# Patient Record
Sex: Male | Born: 1975 | Hispanic: No | Marital: Married | Smoking: Never smoker
Health system: Southern US, Community
[De-identification: ages and names within clinical notes are randomized; demographics above are authoritative.]

## PROBLEM LIST (undated history)

## (undated) DIAGNOSIS — I10 Essential (primary) hypertension: Secondary | ICD-10-CM

## (undated) DIAGNOSIS — K4090 Unilateral inguinal hernia, without obstruction or gangrene, not specified as recurrent: Secondary | ICD-10-CM

## (undated) HISTORY — PX: HAND SURGERY: SHX662

## (undated) HISTORY — PX: APPENDECTOMY: SHX54

---
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
## 2002-10-15 ENCOUNTER — Encounter: Payer: Self-pay | Admitting: Emergency Medicine

## 2002-10-15 ENCOUNTER — Emergency Department (HOSPITAL_COMMUNITY): Admission: EM | Admit: 2002-10-15 | Discharge: 2002-10-15 | Payer: Self-pay | Admitting: Emergency Medicine

## 2002-11-19 ENCOUNTER — Emergency Department (HOSPITAL_COMMUNITY): Admission: EM | Admit: 2002-11-19 | Discharge: 2002-11-20 | Payer: Self-pay | Admitting: Emergency Medicine

## 2002-11-19 ENCOUNTER — Encounter: Payer: Self-pay | Admitting: Emergency Medicine

## 2010-11-02 ENCOUNTER — Ambulatory Visit
Admission: RE | Admit: 2010-11-02 | Discharge: 2010-11-02 | Disposition: A | Discharge status: Home / Self Care | Payer: Self-pay | Source: Ambulatory Visit | Attending: Family Medicine | Admitting: Family Medicine

## 2010-11-02 ENCOUNTER — Other Ambulatory Visit: Payer: Self-pay | Admitting: Family Medicine

## 2010-11-02 DIAGNOSIS — T1490XA Injury, unspecified, initial encounter: Secondary | ICD-10-CM

## 2013-02-18 ENCOUNTER — Other Ambulatory Visit: Payer: Self-pay | Admitting: Pediatrics

## 2013-02-18 ENCOUNTER — Ambulatory Visit: Payer: 59

## 2013-02-18 ENCOUNTER — Other Ambulatory Visit: Payer: Self-pay | Admitting: *Deleted

## 2013-02-18 DIAGNOSIS — S02402A Zygomatic fracture, unspecified, initial encounter for closed fracture: Secondary | ICD-10-CM

## 2016-04-12 ENCOUNTER — Ambulatory Visit: Payer: Self-pay | Admitting: Podiatry

## 2016-04-13 ENCOUNTER — Ambulatory Visit (INDEPENDENT_AMBULATORY_CARE_PROVIDER_SITE_OTHER): Payer: 59 | Admitting: Podiatry

## 2016-04-13 ENCOUNTER — Encounter: Payer: Self-pay | Admitting: Podiatry

## 2016-04-13 DIAGNOSIS — B351 Tinea unguium: Secondary | ICD-10-CM | POA: Diagnosis not present

## 2016-04-13 DIAGNOSIS — M79676 Pain in unspecified toe(s): Secondary | ICD-10-CM | POA: Diagnosis not present

## 2016-04-13 NOTE — Addendum Note (Signed)
Addended byMaury Dus: Yitzchak Kothari L on: 04/13/2016 09:21 AM   Modules accepted: Orders

## 2016-04-13 NOTE — Progress Notes (Signed)
   Subjective:    Patient ID: Andrew Tucker, male    DOB: 06/13/1975, 40 y.o.   MRN: [id number]  HPI's patient presents the office with chief complaint of nail fungus on both big toes. He states that the nails have been yellow and discolored for over a year the discoloration is noted on the inside border and causes occasional pain and discomfort. He says he had stride over-the-counter medications with minimal improvement. He presents the office today for an evaluation and treatment of these discolored toenails    Review of Systems  All other systems reviewed and are negative.      Objective:   Physical Exam GENERAL APPEARANCE: Alert, conversant. Appropriately groomed. No acute distress.  VASCULAR: Pedal pulses are  palpable at  [facility].  Capillary refill time is immediate to all digits,  Normal temperature gradient.  Digital hair growth is present bilateral  NEUROLOGIC: sensation is normal to 5.07 monofilament at 5/5 sites bilateral.  Light touch is intact bilateral, Muscle strength normal.  MUSCULOSKELETAL: acceptable muscle strength, tone and stability bilateral.  Intrinsic muscluature intact bilateral.  Rectus appearance of foot and digits noted bilateral.   DERMATOLOGIC: skin color, texture, and turgor are within normal limits.  No preulcerative lesions or ulcers  are seen, no interdigital maceration noted.   Dry plantar skin noted.. No drainage noted.  NAILS  Thick disfigured discolored hallux toenails  B/L         Assessment & Plan:  Onychomycosis  Hallux  B/L  IE.   nal fragments were taken from both hallux toenails to be sent to the lab.  He will be called when the results arrive.  We discussed Lamisil treatment as well as laser treatment for his nails. After the results the treatment option will be discussed again   Helane GuntherGregory Mayer DPM

## 2016-04-30 ENCOUNTER — Emergency Department: Admission: EM | Admit: 2016-04-30 | Discharge: 2016-04-30 | Discharge status: Absent without leave | Payer: Self-pay

## 2016-09-14 DIAGNOSIS — H9312 Tinnitus, left ear: Secondary | ICD-10-CM | POA: Insufficient documentation

## 2016-09-14 DIAGNOSIS — H9193 Unspecified hearing loss, bilateral: Secondary | ICD-10-CM | POA: Insufficient documentation

## 2018-12-10 ENCOUNTER — Other Ambulatory Visit: Payer: Self-pay

## 2018-12-10 ENCOUNTER — Emergency Department
Admission: EM | Admit: 2018-12-10 | Discharge: 2018-12-10 | Disposition: A | Discharge status: Home / Self Care | Payer: Self-pay | Source: Home / Self Care

## 2018-12-10 ENCOUNTER — Encounter: Payer: Self-pay | Admitting: Emergency Medicine

## 2018-12-10 DIAGNOSIS — S39012A Strain of muscle, fascia and tendon of lower back, initial encounter: Secondary | ICD-10-CM | POA: Diagnosis not present

## 2018-12-10 MED ORDER — CYCLOBENZAPRINE HCL 5 MG PO TABS: 5.0000 mg | ORAL_TABLET | Freq: Every day | ORAL | 0 refills | Status: DC

## 2018-12-10 MED ORDER — PREDNISONE 20 MG PO TABS: ORAL_TABLET | ORAL | 1 refills | Status: DC

## 2018-12-10 NOTE — ED Triage Notes (Signed)
Low back pain x 10 days, described as spasms 5 days later radiating down to left testicle, worse in morning and at night

## 2018-12-10 NOTE — ED Provider Notes (Signed)
[facility]    CSN: [id number] Arrival date & time: 12/10/18  1541     History   Chief Complaint Chief Complaint  Patient presents with  . Back Pain    HPI Morrie Daywalt is a 43 y.o. male.   Initial [facility] visit for this 43 yo man with low back pain.  He believes he hurt his back when wrestling with his 77 yo son 10 days ago in the outdoor pool.  He initially felt central L5 area pain which began radiating along L5 dermatome on last Thursday, 4 days ago.  No weakness, numbness, fever, groin bulge, or h/o back pain  Patient works in the [employer].     History reviewed. No pertinent past medical history.  There are no active problems to display for this patient.   History reviewed. No pertinent surgical history.     Home Medications    Prior to Admission medications   Medication Sig Start Date End Date Taking? Authorizing Provider  acetaminophen (TYLENOL) 500 MG tablet Take 500 mg by mouth every 6 (six) hours as needed.   Yes [provider]  aspirin 325 MG tablet Take 325 mg by mouth daily.   Yes [provider]  ibuprofen (ADVIL) 200 MG tablet Take 200 mg by mouth every 6 (six) hours as needed.   Yes [provider]  Menthol (ICY HOT BACK EXTRA STRENGTH) 5 % PTCH Apply topically.   Yes [provider]  cyclobenzaprine (FLEXERIL) 5 MG tablet Take 1 tablet (5 mg total) by mouth at bedtime. 12/10/18   Robyn Haber, MD  predniSONE (DELTASONE) 20 MG tablet 2 daily with food 12/10/18   Robyn Haber, MD    Family History Family History  Problem Relation Age of Onset  . Cancer Mother     Social History Social History   Tobacco Use  . Smoking status: Never Smoker  . Smokeless tobacco: Never Used  Substance Use Topics  . Alcohol use: Yes    Comment: occas.  . Drug use: No     Allergies   Seasonal ic [cholestatin]   Review of Systems Review of Systems  Constitutional: Negative.    Gastrointestinal: Negative.   Genitourinary: Negative.   Musculoskeletal: Positive for back pain.  All other systems reviewed and are negative.    Physical Exam Triage Vital Signs ED Triage Vitals  Enc Vitals Group     BP      Pulse      Resp      Temp      Temp src      SpO2      Weight      Height      Head Circumference      Peak Flow      Pain Score      Pain Loc      Pain Edu?      Excl. in [place]?    No data found.  Updated Vital Signs BP (!) 146/88 (BP Location: Right Arm)   Pulse (!) 53   Temp 98 F (36.7 C) (Oral)   Ht 5\' 8"  (1.727 m)   Wt 129.3 kg   SpO2 97%   BMI 43.33 kg/m    Physical Exam Vitals signs and nursing note reviewed.  Constitutional:      Appearance: Normal appearance. He is obese.  HENT:     Head: Normocephalic.  Eyes:     Conjunctiva/sclera: Conjunctivae normal.  Neck:  Musculoskeletal: Normal range of motion and neck supple.  Cardiovascular:     Rate and Rhythm: Normal rate.  Pulmonary:     Effort: Pulmonary effort is normal.  Musculoskeletal: Normal range of motion.     Comments: Pain with left side bend, negative SLR, negative point tenderness on back  Skin:    General: Skin is warm and dry.  Neurological:     General: No focal deficit present.     Mental Status: He is alert.  Psychiatric:        Mood and Affect: Mood normal.      UC Treatments / Results  Labs (all labs ordered are listed, but only abnormal results are displayed) Labs Reviewed - No data to display  EKG   Radiology No results found.  Procedures Procedures (including critical care time)  Medications Ordered in UC Medications - No data to display  Initial Impression / Assessment and Plan / UC Course  I have reviewed the triage vital signs and the nursing notes.  Pertinent labs & imaging results that were available during my care of the patient were reviewed by me and considered in my medical decision making (see chart for details).      Final Clinical Impressions(s) / UC Diagnoses   Final diagnoses:  Strain of lumbar region, initial encounter   Discharge Instructions   None    ED Prescriptions    Medication Sig Dispense Auth. Provider   predniSONE (DELTASONE) 20 MG tablet 2 daily with food 10 tablet Elvina SidleLauenstein, Lexi Conaty, MD   cyclobenzaprine (FLEXERIL) 5 MG tablet Take 1 tablet (5 mg total) by mouth at bedtime. 7 tablet Elvina SidleLauenstein, Aalivia Mcgraw, MD     Controlled Substance Prescriptions [place] Controlled Substance Registry consulted? Not Applicable   Elvina SidleLauenstein, Kjerstin Abrigo, MD 12/10/18 [phone]

## 2019-01-14 ENCOUNTER — Other Ambulatory Visit: Payer: Self-pay

## 2019-01-14 ENCOUNTER — Encounter: Payer: Self-pay | Admitting: Family Medicine

## 2019-01-14 ENCOUNTER — Ambulatory Visit (INDEPENDENT_AMBULATORY_CARE_PROVIDER_SITE_OTHER): Payer: 59 | Admitting: Family Medicine

## 2019-01-14 VITALS — BP 166/96 | HR 67 | Wt 284.0 lb

## 2019-01-14 DIAGNOSIS — R109 Unspecified abdominal pain: Secondary | ICD-10-CM

## 2019-01-14 DIAGNOSIS — R1032 Left lower quadrant pain: Secondary | ICD-10-CM

## 2019-01-14 HISTORY — DX: Morbid (severe) obesity due to excess calories: E66.01

## 2019-01-14 LAB — POCT URINALYSIS DIPSTICK
Bilirubin, UA: NEGATIVE
Glucose, UA: NEGATIVE
Ketones, UA: NEGATIVE
Leukocytes, UA: NEGATIVE
Nitrite, UA: NEGATIVE
Protein, UA: NEGATIVE
Spec Grav, UA: 1.015 (ref 1.010–1.025)
Urobilinogen, UA: 0.2 E.U./dL
pH, UA: 6 (ref 5.0–8.0)

## 2019-01-14 NOTE — Patient Instructions (Addendum)
Thank you for coming in today. Get labs now.  You will hear about CT scan scheduling soon.  Let me know if you do not hear anything soon.   Recheck in 2 weeks.  We will likely have more information then and have started on the right treatment. But we also need to follow along blood pressure and other health issues as well.    Flank Pain, Adult Flank pain is pain in your side. The flank is the area of your side between your upper belly (abdomen) and your back. The pain may occur over a short time (acute), or it may be long-term or come back often (chronic). It may be mild or very bad. Pain in this area can be caused by many different things. Follow these instructions at home:   Drink enough fluid to keep your pee (urine) clear or pale yellow.  Rest as told by your doctor.  Take over-the-counter and prescription medicines only as told by your doctor.  Keep a journal to keep track of: ? What has caused your flank pain. ? What has made it feel better.  Keep all follow-up visits as told by your doctor. This is important. Contact a doctor if:  Medicine does not help your pain.  You have new symptoms.  Your pain gets worse.  You have a fever.  Your symptoms last longer than 2-3 days.  You have trouble peeing.  You are peeing more often than normal. Get help right away if:  You have trouble breathing.  You are short of breath.  Your belly hurts, or it is swollen or red.  You feel sick to your stomach (nauseous).  You throw up (vomit).  You feel like you will pass out, or you do pass out (faint).  You have blood in your pee. Summary  Flank pain is pain in your side. The flank is the area of your side between your upper belly (abdomen) and your back.  Flank pain may occur over a short time (acute), or it may be long-term or come back often (chronic). It may be mild or very bad.  Pain in this area can be caused by many different things.  Contact your doctor if your  symptoms get worse or they last longer than 2-3 days. This information is not intended to replace advice given to you by your health care provider. Make sure you discuss any questions you have with your health care provider. Document Released: 01/26/2008 Document Revised: 03/31/2017 Document Reviewed: 08/08/2016 Elsevier Patient Education  2020 [employer].

## 2019-01-14 NOTE — Progress Notes (Signed)
Subjective:    CC: Left low back pain and abdominal pain.  HPI:  Back pain.  Patient developed back pain in early August after wrestling his 43 year old.  He was seen in urgent care on August 10.  At that time he had central low back pain radiating to his groin.  He was found to have back strain and prescribed prednisone and Flexeril.  He notes that he has had pain radiating from his low back to his abdomen and groin.  The pain today stops more in the left lower quadrant of his abdomen does not radiate all the way the groin.  He denies any pain radiating down his leg.  He denies weakness or numbness distally.  He denies any change with bowel movement or with urination.  No fevers chills nausea vomiting or diarrhea.  He notes that he does not have a medical doctor.  He has been told his blood pressure has been elevated in the past.  Past medical history, Surgical history, Family history not pertinant except as noted below, Social history, Allergies, and medications have been entered into the medical record, reviewed, and no changes needed.   Review of Systems: No headache, visual changes, nausea, vomiting, diarrhea, constipation, dizziness, abdominal pain, skin rash, fevers, chills, night sweats, weight loss, swollen lymph nodes, body aches, joint swelling, muscle aches, chest pain, shortness of breath, mood changes, visual or auditory hallucinations.   Objective:    Vitals:   01/14/19 1457  BP: (!) 166/96  Pulse: 67  SpO2: 97%  Body mass index is 43.18 kg/m.   Gen: Well NAD HEENT: EOMI,  MMM Lungs: Normal work of breathing. CTABL Heart: RRR no MRG Abd: NABS, Soft. Nondistended, tender palpation lower left quadrant with no rebound or guarding.  Pain reduces with contraction of abdominal wall musculature. MSK: Nontender to spinal midline.  Not particularly tender lumbar paraspinal musculature.  Normal lumbar motion without pain.  Lower extremity strength reflexes and sensation  are equal normal throughout. Exts: Brisk capillary refill, warm and well perfused.    Lab and Radiology Results Results for orders placed or performed in visit on 01/14/19 (from the past 72 hour(s))  POCT Urinalysis Dipstick     Status: None   Collection Time: 01/14/19  3:17 PM  Result Value Ref Range   Color, UA Yellow    Clarity, UA Clear    Glucose, UA Negative Negative   Bilirubin, UA Negative    Ketones, UA Negative    Spec Grav, UA 1.015 1.010 - 1.025   Blood, UA Trace    pH, UA 6.0 5.0 - 8.0   Protein, UA Negative Negative   Urobilinogen, UA 0.2 0.2 or 1.0 E.U./dL   Nitrite, UA Negative    Leukocytes, UA Negative Negative   Appearance     Odor     No results found.  Impression and Recommendations:    Assessment and Plan: 43 y.o. male with  Left low back pain with left lower quadrant abdominal pain.  Etiology is somewhat unclear.  The majority of his pain seems to be abdominal or flank related and likely not musculoskeletal.  His low back exam was largely benign and his abdominal exam improved with contraction of his abdominal wall musculature which is the opposite of what I would expect from a muscle strain in the abdominal wall..  Urinalysis today was significant for trace lysed blood however we will proceed with proper urine microscopic evaluation to truly rule out blood.  We  will also proceed with basic labs listed below to follow-up today's abdominal pain as well as prepare him for routine medical care.  He does not have a medical doctor and his blood pressure is elevated and he has morbid obesity.  Recheck 2 weeks.  Likely next up will be CT scan of abdomen.  PDMP not reviewed this encounter. Orders Placed This Encounter  Procedures  . CBC with Differential/Platelet  . COMPLETE METABOLIC PANEL WITH GFR  . Lipase  . LDL cholesterol, direct  . Urinalysis, microscopic only  . POCT Urinalysis Dipstick   No orders of the defined types were placed in this  encounter.   Discussed warning signs or symptoms. Please see discharge instructions. Patient expresses understanding.

## 2019-01-15 LAB — CBC WITH DIFFERENTIAL/PLATELET
Absolute Monocytes: 580 cells/uL (ref 200–950)
Basophils Absolute: 40 cells/uL (ref 0–200)
Basophils Relative: 0.4 %
Eosinophils Absolute: 120 cells/uL (ref 15–500)
Eosinophils Relative: 1.2 %
HCT: 40.6 % (ref 38.5–50.0)
Hemoglobin: 13.5 g/dL (ref 13.2–17.1)
Lymphs Abs: 2250 cells/uL (ref 850–3900)
MCH: 29 pg (ref 27.0–33.0)
MCHC: 33.3 g/dL (ref 32.0–36.0)
MCV: 87.1 fL (ref 80.0–100.0)
MPV: 12.2 fL (ref 7.5–12.5)
Monocytes Relative: 5.8 %
Neutro Abs: 7010 cells/uL (ref 1500–7800)
Neutrophils Relative %: 70.1 %
Platelets: 175 10*3/uL (ref 140–400)
RBC: 4.66 10*6/uL (ref 4.20–5.80)
RDW: 12.4 % (ref 11.0–15.0)
Total Lymphocyte: 22.5 %
WBC: 10 10*3/uL (ref 3.8–10.8)

## 2019-01-15 LAB — COMPLETE METABOLIC PANEL WITH GFR
AG Ratio: 1.4 (calc) (ref 1.0–2.5)
ALT: 44 U/L (ref 9–46)
AST: 20 U/L (ref 10–40)
Albumin: 4.1 g/dL (ref 3.6–5.1)
Alkaline phosphatase (APISO): 70 U/L (ref 36–130)
BUN: 18 mg/dL (ref 7–25)
CO2: 26 mmol/L (ref 20–32)
Calcium: 9.3 mg/dL (ref 8.6–10.3)
Chloride: 103 mmol/L (ref 98–110)
Creat: 0.86 mg/dL (ref 0.60–1.35)
GFR, Est African American: 123 mL/min/{1.73_m2} (ref >=60)
GFR, Est Non African American: 106 mL/min/{1.73_m2} (ref >=60)
Globulin: 2.9 g/dL (calc) (ref 1.9–3.7)
Glucose, Bld: 116 mg/dL (ref 65–139)
Potassium: 4 mmol/L (ref 3.5–5.3)
Sodium: 137 mmol/L (ref 135–146)
Total Bilirubin: 0.4 mg/dL (ref 0.2–1.2)
Total Protein: 7 g/dL (ref 6.1–8.1)

## 2019-01-15 LAB — LIPASE: Lipase: 21 U/L (ref 7–60)

## 2019-01-15 LAB — URINALYSIS, MICROSCOPIC ONLY
Bacteria, UA: NONE SEEN /HPF
Hyaline Cast: NONE SEEN /LPF
RBC / HPF: NONE SEEN /HPF (ref 0–2)
Squamous Epithelial / HPF: NONE SEEN /HPF (ref <=5)
WBC, UA: NONE SEEN /HPF (ref 0–5)

## 2019-01-15 LAB — LDL CHOLESTEROL, DIRECT: Direct LDL: 88 mg/dL (ref <100)

## 2019-01-15 NOTE — Addendum Note (Signed)
Addended by: Gregor Hams on: 01/15/2019 07:03 AM   Modules accepted: Orders

## 2019-01-18 ENCOUNTER — Ambulatory Visit (INDEPENDENT_AMBULATORY_CARE_PROVIDER_SITE_OTHER): Payer: 59

## 2019-01-18 ENCOUNTER — Other Ambulatory Visit: Payer: Self-pay

## 2019-01-18 DIAGNOSIS — R109 Unspecified abdominal pain: Secondary | ICD-10-CM

## 2019-01-18 DIAGNOSIS — R1032 Left lower quadrant pain: Secondary | ICD-10-CM

## 2019-01-18 MED ORDER — IOHEXOL 300 MG/ML  SOLN
100.0000 mL | Freq: Once | INTRAMUSCULAR | Status: AC | PRN
  Administered 2019-01-18: 100 mL via INTRAVENOUS

## 2019-01-21 ENCOUNTER — Telehealth: Payer: Self-pay | Admitting: Family Medicine

## 2019-01-21 DIAGNOSIS — K4090 Unilateral inguinal hernia, without obstruction or gangrene, not specified as recurrent: Secondary | ICD-10-CM | POA: Insufficient documentation

## 2019-01-21 HISTORY — DX: Unilateral inguinal hernia, without obstruction or gangrene, not specified as recurrent: K40.90

## 2019-01-21 NOTE — Telephone Encounter (Signed)
Hernia present on CT scan.  Plan to refer to general surgery for evaluation and possible management.

## 2019-01-28 ENCOUNTER — Other Ambulatory Visit: Payer: Self-pay

## 2019-01-28 ENCOUNTER — Encounter: Payer: Self-pay | Admitting: Family Medicine

## 2019-01-28 ENCOUNTER — Ambulatory Visit: Payer: 59 | Admitting: Family Medicine

## 2019-01-28 VITALS — BP 155/97 | HR 66 | Wt 286.2 lb

## 2019-01-28 DIAGNOSIS — I10 Essential (primary) hypertension: Secondary | ICD-10-CM

## 2019-01-28 DIAGNOSIS — M545 Low back pain, unspecified: Secondary | ICD-10-CM

## 2019-01-28 DIAGNOSIS — K4090 Unilateral inguinal hernia, without obstruction or gangrene, not specified as recurrent: Secondary | ICD-10-CM

## 2019-01-28 MED ORDER — LISINOPRIL-HYDROCHLOROTHIAZIDE 10-12.5 MG PO TABS: 1.0000 | ORAL_TABLET | Freq: Every day | ORAL | 1 refills | Status: DC

## 2019-01-28 NOTE — Progress Notes (Signed)
Andrew Tucker is a 43 y.o. male who presents to [facility]: Primary Care Sports Medicine today for follow up back/flank pain, and HTN.   LEFT Back/Flank/Abdominal Pain - Patient has had ongoing left sided lower back/flank since July that radiates to left lower quandrant of abdomen. Patient was wrestling with his son at the time the pain began. The pain has worsened since last visit two weeks ago. Pain does not worsen with eating. He denies nausea, vomiting, headache, fever, chills, diarrhea, melena, or hematochezia. He denies any right-sided pain. He reports recent constipation. Patient had CT Abdomen on 9/18 to evaluate pain which revealed a right sided inguinal hernia with no evidence of incarceration or strangulation.   HTN - Pt had an elevated BP at last visit of 166/96. Today BP is 155/97. Patient denies chest pain, SOB, LE edema.    ROS as above:  Exam:  BP (!) 155/97   Pulse 66   Wt 286 lb 3.2 oz (129.8 kg)   BMI 43.52 kg/m  Wt Readings from Last 5 Encounters:  01/28/19 286 lb 3.2 oz (129.8 kg)  01/14/19 284 lb (128.8 kg)  12/10/18 285 lb (129.3 kg)    Gen: Well NAD HEENT: EOMI,  MMM Lungs: Normal work of breathing.  Heart: No lower extremity edema.  Abd: NABS, Soft. Nondistended, No palpable masses. No rebound tenderness or guarding. No pain with contraction  of abdominal wall muscles. Tenderness with deep palpation of left lower quadrant.  Exts: Brisk capillary refill, warm and well perfused.  GU: Palpable right-sided inguinal hernia.  Not fully reducible.  Not particularly tender. MSK: L-spine: Normal-appearing.  Nontender spinal midline.  Nontender paraspinal musculature. Normal lumbar motion. .   Lab and Radiology Results Ct Abdomen Pelvis W Contrast  Result Date: 01/18/2019 CLINICAL DATA:  43 year old male with history of acute generalized abdominal pain. Left-sided  back pain. EXAM: CT ABDOMEN AND PELVIS WITH CONTRAST TECHNIQUE: Multidetector CT imaging of the abdomen and pelvis was performed using the standard protocol following bolus administration of intravenous contrast. CONTRAST:  OMNIPAQUE IOHEXOL 300 MG/ML  SOLN COMPARISON:  No priors. FINDINGS: Lower chest: Unremarkable. Hepatobiliary: No suspicious cystic or solid hepatic lesions. No intra or extrahepatic biliary ductal dilatation. Gallbladder is normal in appearance. Pancreas: No pancreatic mass. No pancreatic ductal dilatation. No pancreatic or peripancreatic fluid collections or inflammatory changes. Spleen: Unremarkable. Adrenals/Urinary Tract: Bilateral kidneys and adrenal glands are normal in appearance. No hydroureteronephrosis. The anterolateral aspect of the urinary bladder on the right partially extends into a right inguinoscrotal hernia. Stomach/Bowel: Normal appearance of the stomach. No pathologic dilatation of small bowel or colon. The appendix is not confidently identified and may be surgically absent. Regardless, there are no inflammatory changes noted adjacent to the cecum to suggest the presence of an acute appendicitis at this time. Vascular/Lymphatic: No significant atherosclerotic disease, aneurysm or dissection noted in the abdominal or pelvic vasculature. No lymphadenopathy noted in the abdomen or pelvis. Reproductive: Prostate gland and seminal vesicles are unremarkable in appearance. Other: Large right inguinoscrotal hernia containing predominantly fat, as well as a small portion of the anterolateral aspect of the urinary bladder on the right side. No significant volume of ascites. No pneumoperitoneum. Musculoskeletal: There are no aggressive appearing lytic or blastic lesions noted in the visualized portions of the skeleton. IMPRESSION: 1. No acute findings are noted in the abdomen or pelvis. 2. There is a large right inguinoscrotal hernia which contains predominantly fat, as well as  a  small portion of the anterolateral aspect of the urinary bladder on the right side. Electronically Signed   By: Trudie Reedaniel  Entrikin M.D.   On: 01/18/2019 14:22   I personally (independently) visualized and performed the interpretation of the images attached in this note.   Recent Results (from the past 2160 hour(s))  POCT Urinalysis Dipstick     Status: None   Collection Time: 01/14/19  3:17 PM  Result Value Ref Range   Color, UA Yellow    Clarity, UA Clear    Glucose, UA Negative Negative   Bilirubin, UA Negative    Ketones, UA Negative    Spec Grav, UA 1.015 1.010 - 1.025   Blood, UA Trace    pH, UA 6.0 5.0 - 8.0   Protein, UA Negative Negative   Urobilinogen, UA 0.2 0.2 or 1.0 E.U./dL   Nitrite, UA Negative    Leukocytes, UA Negative Negative   Appearance     Odor    Urinalysis, microscopic only     Status: None   Collection Time: 01/14/19  3:26 PM  Result Value Ref Range   WBC, UA NONE SEEN 0 - 5 /HPF   RBC / HPF NONE SEEN 0 - 2 /HPF   Squamous Epithelial / LPF NONE SEEN < OR = 5 /HPF   Bacteria, UA NONE SEEN NONE SEEN /HPF   Hyaline Cast NONE SEEN NONE SEEN /LPF  CBC with Differential/Platelet     Status: None   Collection Time: 01/14/19  3:43 PM  Result Value Ref Range   WBC 10.0 3.8 - 10.8 Thousand/uL   RBC 4.66 4.20 - 5.80 Million/uL   Hemoglobin 13.5 13.2 - 17.1 g/dL   HCT 16.140.6 09.638.5 - 04.550.0 %   MCV 87.1 80.0 - 100.0 fL   MCH 29.0 27.0 - 33.0 pg   MCHC 33.3 32.0 - 36.0 g/dL   RDW 40.912.4 81.111.0 - 91.415.0 %   Platelets 175 140 - 400 Thousand/uL   MPV 12.2 7.5 - 12.5 fL   Neutro Abs 7,010 1,500 - 7,800 cells/uL   Lymphs Abs 2,250 850 - 3,900 cells/uL   Absolute Monocytes 580 200 - 950 cells/uL   Eosinophils Absolute 120 15 - 500 cells/uL   Basophils Absolute 40 0 - 200 cells/uL   Neutrophils Relative % 70.1 %   Total Lymphocyte 22.5 %   Monocytes Relative 5.8 %   Eosinophils Relative 1.2 %   Basophils Relative 0.4 %  COMPLETE METABOLIC PANEL WITH GFR     Status: None    Collection Time: 01/14/19  3:43 PM  Result Value Ref Range   Glucose, Bld 116 65 - 139 mg/dL    Comment: .        Non-fasting reference interval .    BUN 18 7 - 25 mg/dL   Creat 7.820.86 9.560.60 - 2.131.35 mg/dL   GFR, Est Non African American 106 > OR = 60 mL/min/1.9673m2   GFR, Est African American 123 > OR = 60 mL/min/1.4373m2   BUN/Creatinine Ratio NOT APPLICABLE 6 - 22 (calc)   Sodium 137 135 - 146 mmol/L   Potassium 4.0 3.5 - 5.3 mmol/L   Chloride 103 98 - 110 mmol/L   CO2 26 20 - 32 mmol/L   Calcium 9.3 8.6 - 10.3 mg/dL   Total Protein 7.0 6.1 - 8.1 g/dL   Albumin 4.1 3.6 - 5.1 g/dL   Globulin 2.9 1.9 - 3.7 g/dL (calc)   AG Ratio 1.4 1.0 - 2.5 (  calc)   Total Bilirubin 0.4 0.2 - 1.2 mg/dL   Alkaline phosphatase (APISO) 70 36 - 130 U/L   AST 20 10 - 40 U/L   ALT 44 9 - 46 U/L  Lipase     Status: None   Collection Time: 01/14/19  3:43 PM  Result Value Ref Range   Lipase 21 7 - 60 U/L  LDL cholesterol, direct     Status: None   Collection Time: 01/14/19  3:43 PM  Result Value Ref Range   Direct LDL 88 <100 mg/dL    Comment: Greatly elevated Triglycerides values (>1200 mg/dL) interfere with the dLDL assay. As no Triglycerides  testing was ordered, interpret results with caution. . Desirable range <100 mg/dL for primary prevention;   <70 mg/dL for patients with CHD or diabetic patients  with > or = 2 CHD risk factors. .      Assessment and Plan: 43 y.o. male with a right-sided inguinal hernia that presents with lower flank pain that radiates to the left lower quadrant and HTN.   Left Sided Flank and Abdominal Pain Pain is likely musculoskeletal in origin however cannot rule out inguinal hernia referred pain as cause.  It is unlikely that the right-sided inguinal hernia is causing his left-sided pain.  Will refer to general surgery as below.  If surgery not planned in near future neck step will be physical therapy trial for evaluation and management of likely musculoskeletal pain  left side.  Right inguinal hernia: Patient already has an appointment scheduled with general surgery in October 1.  He has a relatively large hernia that will likely benefit from surgical repair.  HTN BP 155/67 today. This is patient's 2nd elevated BP reading. Will start Lisinopril-HCTZ 10-12.5 mg daily for HTN.  Recheck in about a month.   PDMP not reviewed this encounter. No orders of the defined types were placed in this encounter.  Meds ordered this encounter  Medications  . lisinopril-hydrochlorothiazide (ZESTORETIC) 10-12.5 MG tablet    Sig: Take 1 tablet by mouth daily.    Dispense:  90 tablet    Refill:  1     Historical information moved to improve visibility of documentation.  Past Medical History:  Diagnosis Date  . Hypertension 01/29/2019  . Morbid obesity ([place]) 01/14/2019  . Non-recurrent unilateral inguinal hernia without obstruction or gangrene 01/21/2019   No past surgical history on file. Social History   Tobacco Use  . Smoking status: Never Smoker  . Smokeless tobacco: Never Used  Substance Use Topics  . Alcohol use: Yes    Comment: occas.   family history includes Cancer in his mother.  Medications: Current Outpatient Medications  Medication Sig Dispense Refill  . acetaminophen (TYLENOL) 500 MG tablet Take 500 mg by mouth every 6 (six) hours as needed.    Marland Kitchen aspirin 325 MG tablet Take 325 mg by mouth daily.    Marland Kitchen ibuprofen (ADVIL) 200 MG tablet Take 200 mg by mouth every 6 (six) hours as needed.    . Menthol (ICY HOT BACK EXTRA STRENGTH) 5 % PTCH Apply topically.    Marland Kitchen lisinopril-hydrochlorothiazide (ZESTORETIC) 10-12.5 MG tablet Take 1 tablet by mouth daily. 90 tablet 1   No current facility-administered medications for this visit.    Allergies  Allergen Reactions  . Seasonal Ic [Cholestatin]      Discussed warning signs or symptoms. Please see discharge instructions. Patient expresses understanding.  I personally was present and performed or  re-performed the history, physical exam  and medical decision-making activities of this service and have verified that the service and findings are accurately documented in the student's note. ___________________________________________ Clementeen Graham M.D., ABFM., CAQSM. Primary Care and Sports Medicine Adjunct Instructor of Family Medicine  [employer]

## 2019-01-28 NOTE — Patient Instructions (Addendum)
Thank you for coming in today. Follow up with Surgery.  If surgery not planned in near future proceed with PT. Let me know.  Take lisinopril/HCTZ daily  Keep track of blood pressure.  Recheck in 4 weeks.    Inguinal Hernia, Adult An inguinal hernia is when fat or your intestines push through a weak spot in a muscle where your leg meets your lower belly (groin). This causes a rounded lump (bulge). This kind of hernia could also be:  In your scrotum, if you are male.  In folds of skin around your vagina, if you are male. There are three types of inguinal hernias. These include:  Hernias that can be pushed back into the belly (are reducible). This type rarely causes pain.  Hernias that cannot be pushed back into the belly (are incarcerated).  Hernias that cannot be pushed back into the belly and lose their blood supply (are strangulated). This type needs emergency surgery. If you do not have symptoms, you may not need treatment. If you have symptoms or a large hernia, you may need surgery. Follow these instructions at home: Lifestyle  Do these things if told by your doctor so you do not have trouble pooping (constipation): ? Drink enough fluid to keep your pee (urine) pale yellow. ? Eat foods that have a lot of fiber. These include fresh fruits and vegetables, whole grains, and beans. ? Limit foods that are high in fat and processed sugars. These include foods that are fried or sweet. ? Take medicine for trouble pooping.  Avoid lifting heavy objects.  Avoid standing for long amounts of time.  Do not use any products that contain nicotine or tobacco. These include cigarettes and e-cigarettes. If you need help quitting, ask your doctor.  Stay at a healthy weight. General instructions  You may try to push your hernia in by very gently pressing on it when you are lying down. Do not try to force the bulge back in if it will not push in easily.  Watch your hernia for any changes in  shape, size, or color. Tell your doctor if you see any changes.  Take over-the-counter and prescription medicines only as told by your doctor.  Keep all follow-up visits as told by your doctor. This is important. Contact a doctor if:  You have a fever.  You have new symptoms.  Your symptoms get worse. Get help right away if:  The area where your leg meets your lower belly has: ? Pain that gets worse suddenly. ? A bulge that gets bigger suddenly, and it does not get smaller after that. ? A bulge that turns red or purple. ? A bulge that is painful when you touch it.  You are a man, and your scrotum: ? Suddenly feels painful. ? Suddenly changes in size.  You cannot push the hernia in by very gently pressing on it when you are lying down. Do not try to force the bulge back in if it will not push in easily.  You feel sick to your stomach (nauseous), and that feeling does not go away.  You throw up (vomit), and that keeps happening.  You have a fast heartbeat.  You cannot poop (have a bowel movement) or pass gas. These symptoms may be an emergency. Do not wait to see if the symptoms will go away. Get medical help right away. Call your local emergency services (911 in the [place] Summary  An inguinal hernia is when fat or your intestines push  through a weak spot in a muscle where your leg meets your lower belly (groin). This causes a rounded lump (bulge).  If you do not have symptoms, you may not need treatment. If you have symptoms or a large hernia, you may need surgery.  Avoid lifting heavy objects. Also avoid standing for long amounts of time.  Do not try to force the bulge back in if it will not push in easily. This information is not intended to replace advice given to you by your health care provider. Make sure you discuss any questions you have with your health care provider. Document Released: 05/19/2006 Document Revised: 05/20/2017 Document Reviewed: 01/18/2017 Elsevier  Patient Education  2020 [employer].

## 2019-01-29 ENCOUNTER — Encounter: Payer: Self-pay | Admitting: Family Medicine

## 2019-01-29 DIAGNOSIS — I10 Essential (primary) hypertension: Secondary | ICD-10-CM

## 2019-01-29 HISTORY — DX: Essential (primary) hypertension: I10

## 2019-01-31 ENCOUNTER — Ambulatory Visit: Payer: Self-pay | Admitting: General Surgery

## 2019-01-31 NOTE — H&P (Signed)
History of Present Illness Axel Filler MD; 01/31/2019 9:47 AM) The patient is a 43 year old male who presents with an inguinal hernia. Referred by: Dr. Denyse Amass Chief Complaint: Right inguinal hernia  Patient is a 43 year old male, who comes in secondary to a right inguinal hernia seen on CT scan. Patient has a history of hypertension. Patient just recently started treatment on this this week. Patient states he was having left back and left inguinal pain. He states that his given a muscle relaxer to see if this helps however this continues to bother him. Patient was sent for CT scan to further evaluate the pain. At this time patient was found have a large right inguinal scrotal hernia. This was unknown to the patient. Patient has had continuing left lower quadrant abdominal pain and left back pain. Patient works at [employer], he does have an active lifestyle.  I did review CT scan personally. CT scan did reveal a right large inguinal hernia with sliding component The bladder.    Past Surgical History Schuyler Amor, CMA; 01/31/2019 9:30 AM) Appendectomy   Diagnostic Studies History Schuyler Amor, CMA; 01/31/2019 9:30 AM) Colonoscopy  never  Allergies Schuyler Amor, CMA; 01/31/2019 9:31 AM) No Known Drug Allergies  [01/31/2019]: Allergies Reconciled   Medication History Schuyler Amor, CMA; 01/31/2019 9:31 AM) Lisinopril (10MG  Tablet, Oral) Active. Medications Reconciled  Social History , CMA; 01/31/2019 9:30 AM) Alcohol use  Occasional alcohol use. Caffeine use  Coffee. No drug use  Tobacco use  Never smoker.  Family History 04/02/2019, CMA; 01/31/2019 9:30 AM) Diabetes Mellitus  Father. Rectal Cancer  Mother.  Other Problems 04/02/2019, CMA; 01/31/2019 9:30 AM) Back Pain  High blood pressure     Review of Systems 04/02/2019 MD; 01/31/2019 9:45 AM) General Not Present- Appetite Loss, Chills, Fatigue, Fever, Night Sweats, Weight  Gain and Weight Loss. Skin Not Present- Change in Wart/Mole, Dryness, Hives, Jaundice, New Lesions, Non-Healing Wounds, Rash and Ulcer. HEENT Not Present- Earache, Hearing Loss, Hoarseness, Nose Bleed, Oral Ulcers, Ringing in the Ears, Seasonal Allergies, Sinus Pain, Sore Throat, Visual Disturbances, Wears glasses/contact lenses and Yellow Eyes. Respiratory Not Present- Bloody sputum, Chronic Cough, Difficulty Breathing, Snoring and Wheezing. Breast Not Present- Breast Mass, Breast Pain, Nipple Discharge and Skin Changes. Cardiovascular Not Present- Chest Pain, Difficulty Breathing Lying Down, Leg Cramps, Palpitations, Rapid Heart Rate, Shortness of Breath and Swelling of Extremities. Gastrointestinal Present- Abdominal Pain. Not Present- Bloating, Bloody Stool, Change in Bowel Habits, Chronic diarrhea, Constipation, Difficulty Swallowing, Excessive gas, Gets full quickly at meals, Hemorrhoids, Indigestion, Nausea, Rectal Pain and Vomiting. Male Genitourinary Not Present- Blood in Urine, Change in Urinary Stream, Frequency, Impotence, Nocturia, Painful Urination, Urgency and Urine Leakage. Musculoskeletal Present- Back Pain and Muscle Pain. Not Present- Joint Pain, Joint Stiffness, Muscle Weakness and Swelling of Extremities. Neurological Not Present- Decreased Memory, Fainting, Headaches, Numbness, Seizures, Tingling, Tremor, Trouble walking and Weakness. Psychiatric Not Present- Anxiety, Bipolar, Change in Sleep Pattern, Depression, Fearful and Frequent crying. Endocrine Not Present- Cold Intolerance, Excessive Hunger, Hair Changes, Heat Intolerance, Hot flashes and New Diabetes. Hematology Not Present- Blood Thinners, Easy Bruising, Excessive bleeding, Gland problems, HIV and Persistent Infections. All other systems negative  Vitals 04/02/2019 CMA; 01/31/2019 9:32 AM) 01/31/2019 9:32 AM Weight: 280 lb Height: 68in Body Surface Area: 2.36 m Body Mass Index: 42.57 kg/m  Temp.: 98.53F   Pulse: 78 (Regular)  BP: 170/100(Sitting, Left Arm, Standard)       Physical Exam 04/02/2019 MD; 01/31/2019  9:47 AM) The physical exam findings are as follows: Note: Constitutional: No acute distress, conversant, appears stated age  Eyes: Anicteric sclerae, moist conjunctiva, no lid lag  Neck: No thyromegaly, trachea midline, no cervical lymphadenopathy  Lungs: Clear to auscultation biilaterally, normal respiratory effot  Cardiovascular: regular rate & rhythm, no murmurs, no peripheal edema, pedal pulses 2+  GI: Soft, no masses or hepatosplenomegaly, non-tender to palpation  MSK: Normal gait, no clubbing cyanosis, edema  Skin: No rashes, palpation reveals normal skin turgor  Psychiatric: Appropriate judgment and insight, oriented to person, place, and time  Abdomen Inspection Hernias - Inguinal hernia - Right - Reducible - Right (Large inguinal scrotal) .    Assessment & Plan Ralene Ok MD; 01/31/2019 9:48 AM)  RIGHT INGUINAL HERNIA (K40.90) Impression: 43 year old male with a history of right inguinal hernia, history of hypertension Patient follow-up with his PCP at the end of October for blood pressure check. 1. The patient will like to proceed to the operating room for open right inguinal hernia repair with mesh.  2. I discussed with the patient the signs and symptoms of incarceration and strangulation and the need to proceed to the ER should they occur.  3. I discussed with the patient the risks and benefits of the procedure to include but not limited to: Infection, bleeding, damage to surrounding structures, possible need for further surgery, possible nerve pain, and possible recurrence. The patient was understanding and wishes to proceed.

## 2019-02-25 ENCOUNTER — Encounter: Payer: Self-pay | Admitting: Family Medicine

## 2019-02-25 ENCOUNTER — Other Ambulatory Visit: Payer: Self-pay

## 2019-02-25 ENCOUNTER — Ambulatory Visit (INDEPENDENT_AMBULATORY_CARE_PROVIDER_SITE_OTHER): Payer: 59 | Admitting: Family Medicine

## 2019-02-25 VITALS — BP 135/87 | HR 74 | Temp 98.3°F | Wt 283.0 lb

## 2019-02-25 DIAGNOSIS — M545 Low back pain, unspecified: Secondary | ICD-10-CM

## 2019-02-25 DIAGNOSIS — K4090 Unilateral inguinal hernia, without obstruction or gangrene, not specified as recurrent: Secondary | ICD-10-CM

## 2019-02-25 DIAGNOSIS — I10 Essential (primary) hypertension: Secondary | ICD-10-CM | POA: Diagnosis not present

## 2019-02-25 MED ORDER — LISINOPRIL-HYDROCHLOROTHIAZIDE 20-25 MG PO TABS: 1.0000 | ORAL_TABLET | Freq: Every day | ORAL | 3 refills | Status: DC

## 2019-02-25 NOTE — Patient Instructions (Signed)
Thank you for coming in today. Increase the lisinopril/HCTZ to 20/25.  Take it in the morning.  Get labs in about 2 weeks.  Let me know if the pain does not improve after surgery.  NEXT step is physical therapy.

## 2019-02-25 NOTE — Progress Notes (Signed)
Andrew Tucker is a 43 y.o. male who presents to [facility] today for back pain.  Patient developed acute onset of back pain in August wrestling his 61 year old son.  Pain radiating from low back to abdomen and groin on the left side of his abdomen.  No radiating pain weakness or numbness distally.  Pain was thought to be possibly abdominal muscular strain.  He had CT scan of the abdomen which showed a right-sided inguinal hernia but no explanation for his left-sided pain.  He was referred to general surgery for hernia repair and has surgery scheduled November 20.  He notes the pain is still a little bit present but a lot better.  He feels pain more with activity and better with rest.  No radiating pain or numbness.  Additionally he has a history of elevated blood pressure was finally diagnosed with hypertension on September 28.  Treated empirically with lisinopril/hydrochlorothiazide 10/12.5.  He feels well no chest pain palpitations lightheadedness or dizziness.  ROS as above:  Exam:  BP 135/87   Pulse 74   Temp 98.3 F (36.8 C) (Oral)   Wt 283 lb (128.4 kg)   BMI 43.03 kg/m  Wt Readings from Last 5 Encounters:  02/25/19 283 lb (128.4 kg)  01/28/19 286 lb 3.2 oz (129.8 kg)  01/14/19 284 lb (128.8 kg)  12/10/18 285 lb (129.3 kg)    Gen: Well NAD HEENT: EOMI,  MMM Lungs: Normal work of breathing. CTABL Heart: RRR no MRG Abd: NABS, Soft. Nondistended, Nontender Exts: Brisk capillary refill, warm and well perfused.      Assessment and Plan: 43 y.o. male with  Hypertension: Blood pressure still a bit elevated.  Plan increase to lisinopril/hydrochlorothiazide 20/25.  Basic metabolic panel ordered plan to get that done in the next 2 to 3 weeks.  If all is well from a hypertension standpoint recheck yearly.  Back pain: Likely myofascial strain and core  muscular dysfunction.  Discussed options.  At this point will see how things go after surgery.  If still having pain would likely refer to physical therapy at that point.  Inguinal hernia: Surgery pending November 20.  PDMP not reviewed this encounter. Orders Placed This Encounter  Procedures  . BASIC METABOLIC PANEL WITH GFR   Meds ordered this encounter  Medications  . lisinopril-hydrochlorothiazide (ZESTORETIC) 20-25 MG tablet    Sig: Take 1 tablet by mouth daily.    Dispense:  90 tablet    Refill:  3     Historical information moved to improve visibility of documentation.  Past Medical History:  Diagnosis Date  . Hypertension 01/29/2019  . Morbid obesity ([place]) 01/14/2019  . Non-recurrent unilateral inguinal hernia without obstruction or gangrene 01/21/2019   No past surgical history on file. Social History   Tobacco Use  . Smoking status: Never Smoker  . Smokeless tobacco: Never Used  Substance Use Topics  . Alcohol use: Yes    Comment: occas.   family history includes Cancer in his mother.  Medications: Current Outpatient Medications  Medication Sig Dispense Refill  . acetaminophen (TYLENOL) 500 MG tablet Take 500 mg by mouth every 6 (six) hours as needed.    Marland Kitchen aspirin 325 MG tablet Take 325 mg by mouth daily.    Marland Kitchen ibuprofen (ADVIL) 200 MG tablet Take 200 mg by mouth every 6 (six) hours as needed.    Marland Kitchen lisinopril-hydrochlorothiazide (ZESTORETIC) 20-25 MG tablet Take 1 tablet by  mouth daily. 90 tablet 3  . Menthol (ICY HOT BACK EXTRA STRENGTH) 5 % PTCH Apply topically.     No current facility-administered medications for this visit.    Allergies  Allergen Reactions  . Seasonal Ic [Cholestatin]      Discussed warning signs or symptoms. Please see discharge instructions. Patient expresses understanding.

## 2019-03-14 LAB — BASIC METABOLIC PANEL WITH GFR
BUN: 19 mg/dL (ref 7–25)
CO2: 28 mmol/L (ref 20–32)
Calcium: 9.6 mg/dL (ref 8.6–10.3)
Chloride: 99 mmol/L (ref 98–110)
Creat: 0.85 mg/dL (ref 0.60–1.35)
GFR, Est African American: 124 mL/min/{1.73_m2} (ref >=60)
GFR, Est Non African American: 107 mL/min/{1.73_m2} (ref >=60)
Glucose, Bld: 100 mg/dL — ABNORMAL HIGH (ref 65–99)
Potassium: 4.6 mmol/L (ref 3.5–5.3)
Sodium: 134 mmol/L — ABNORMAL LOW (ref 135–146)

## 2019-03-18 NOTE — Progress Notes (Signed)
[facility] [phone] - [place], [place] - [address] [place] [address] Phone: [phone] Fax: [phone]      Your procedure is scheduled on Friday, 03/22/2019.  Report to [facility] Main Entrance "A" at 07:00 A.M., and check in at the Admitting office.  Call this number if you have problems the morning of surgery:  [phone]   Call [phone] if you have any questions prior to your surgery date Monday-Friday 8am-4pm    Remember:  Do not eat or drink after midnight the night before your surgery    DO NOT take any medications the day of surgery  As of now, STOP taking any Aspirin (unless otherwise instructed by your surgeon), Aleve, Naproxen, Ibuprofen, Motrin, Advil, Goody's, BC's, all herbal medications, fish oil, and all vitamins.    The Morning of Surgery  Do not wear jewelry.  Do not wear lotions, powders, colognes, or deodorant  Men may shave face and neck.  Do not bring valuables to the hospital.  [facility] is not responsible for any belongings or valuables.  If you are a smoker, DO NOT Smoke 24 hours prior to surgery  If you wear a CPAP at night please bring your mask, tubing, and machine the morning of surgery   Remember that you must have someone to transport you home after your surgery, and remain with you for 24 hours if you are discharged the same day.   Contacts, glasses, hearing aids, dentures or bridgework may not be worn into surgery.    Leave your suitcase in the car.  After surgery it may be brought to your room.  For patients admitted to the hospital, discharge time will be determined by your treatment team.  Patients discharged the day of surgery will not be allowed to drive home.    Special instructions:   [facility]- Preparing For Surgery  Before surgery, you can play an important role. Because skin is not sterile, your skin needs to be as free of germs as possible. You can reduce the number  of germs on your skin by washing with CHG (chlorahexidine gluconate) Soap before surgery.  CHG is an antiseptic cleaner which kills germs and bonds with the skin to continue killing germs even after washing.    Oral Hygiene is also important to reduce your risk of infection.  Remember - BRUSH YOUR TEETH THE MORNING OF SURGERY WITH YOUR REGULAR TOOTHPASTE  Please do not use if you have an allergy to CHG or antibacterial soaps. If your skin becomes reddened/irritated stop using the CHG.  Do not shave (including legs and underarms) for at least 48 hours prior to first CHG shower. It is OK to shave your face.  Please follow these instructions carefully.   1. Shower the NIGHT BEFORE SURGERY and the MORNING OF SURGERY with CHG Soap.   2. If you chose to wash your hair, wash your hair first as usual with your normal shampoo.  3. After you shampoo, rinse your hair and body thoroughly to remove the shampoo.  4. Use CHG as you would any other liquid soap. You can apply CHG directly to the skin and wash gently with a scrungie or a clean washcloth.   5. Apply the CHG Soap to your body ONLY FROM THE NECK DOWN.  Do not use on open wounds or open sores. Avoid contact with your eyes, ears, mouth and genitals (private parts). Wash Face and genitals (private parts)  with your normal  soap.   6. Wash thoroughly, paying special attention to the area where your surgery will be performed.  7. Thoroughly rinse your body with warm water from the neck down.  8. DO NOT shower/wash with your normal soap after using and rinsing off the CHG Soap.  9. Pat yourself dry with a CLEAN TOWEL.  10. Wear CLEAN PAJAMAS to bed the night before surgery, wear comfortable clothes the morning of surgery  11. Place CLEAN SHEETS on your bed the night of your first shower and DO NOT SLEEP WITH PETS.    Day of Surgery:  Please shower the morning of surgery with the CHG soap Do not apply any deodorants/lotions.  Please wear  clean clothes to the hospital/surgery center.   Remember to brush your teeth WITH YOUR REGULAR TOOTHPASTE.   Please read over the following fact sheets that you were given.

## 2019-03-19 ENCOUNTER — Other Ambulatory Visit: Payer: Self-pay

## 2019-03-19 ENCOUNTER — Other Ambulatory Visit (HOSPITAL_COMMUNITY)
Admission: RE | Admit: 2019-03-19 | Discharge: 2019-03-19 | Disposition: A | Discharge status: Home / Self Care | Payer: 59 | Source: Ambulatory Visit | Attending: General Surgery | Admitting: General Surgery

## 2019-03-19 ENCOUNTER — Encounter (HOSPITAL_COMMUNITY): Payer: Self-pay

## 2019-03-19 ENCOUNTER — Encounter (HOSPITAL_COMMUNITY)
Admission: RE | Admit: 2019-03-19 | Discharge: 2019-03-19 | Disposition: A | Discharge status: Home / Self Care | Payer: 59 | Source: Ambulatory Visit | Attending: General Surgery | Admitting: General Surgery

## 2019-03-19 DIAGNOSIS — Z20828 Contact with and (suspected) exposure to other viral communicable diseases: Secondary | ICD-10-CM | POA: Insufficient documentation

## 2019-03-19 DIAGNOSIS — Z01818 Encounter for other preprocedural examination: Secondary | ICD-10-CM | POA: Insufficient documentation

## 2019-03-19 LAB — CBC
HCT: 42.7 % (ref 39.0–52.0)
Hemoglobin: 14.6 g/dL (ref 13.0–17.0)
MCH: 29.8 pg (ref 26.0–34.0)
MCHC: 34.2 g/dL (ref 30.0–36.0)
MCV: 87.1 fL (ref 80.0–100.0)
Platelets: 313 10*3/uL (ref 150–400)
RBC: 4.9 MIL/uL (ref 4.22–5.81)
RDW: 11.8 % (ref 11.5–15.5)
WBC: 8.6 10*3/uL (ref 4.0–10.5)
nRBC: 0 % (ref 0.0–0.2)

## 2019-03-19 NOTE — Progress Notes (Signed)
PCP - Dr. Lynne Leader Cardiologist - Patient denies  PPM/ICD - n/a Device Orders -  Rep Notified -   Chest x-ray - n/a EKG - 03/19/2019 Stress Test - Patient denies ECHO - Patient denies Cardiac Cath - Patient denies  Sleep Study - Patient denies CPAP -   Fasting Blood Sugar - n/a Checks Blood Sugar _____ times a day  Blood Thinner Instructions: n/a Aspirin Instructions:  ERAS Protcol - n/a PRE-SURGERY Ensure or G2-   COVID TEST- 03/19/2019   Anesthesia review: n/a  Patient denies shortness of breath, fever, cough and chest pain at PAT appointment   All instructions explained to the patient, with a verbal understanding of the material. Patient agrees to go over the instructions while at home for a better understanding. Patient also instructed to self quarantine after being tested for COVID-19. The opportunity to ask questions was provided.

## 2019-03-20 LAB — NOVEL CORONAVIRUS, NAA (HOSP ORDER, SEND-OUT TO REF LAB; TAT 18-24 HRS): SARS-CoV-2, NAA: NOT DETECTED

## 2019-03-21 MED ORDER — DEXTROSE 5 % IV SOLN
3.0000 g | INTRAVENOUS | Status: AC
  Administered 2019-03-22: 3 g via INTRAVENOUS
  Filled 2019-03-21: qty 3

## 2019-03-22 ENCOUNTER — Encounter (HOSPITAL_COMMUNITY): Payer: Self-pay

## 2019-03-22 ENCOUNTER — Ambulatory Visit (HOSPITAL_COMMUNITY): Payer: 59 | Admitting: Anesthesiology

## 2019-03-22 ENCOUNTER — Other Ambulatory Visit: Payer: Self-pay

## 2019-03-22 ENCOUNTER — Encounter (HOSPITAL_COMMUNITY)
Admission: RE | Disposition: A | Discharge status: Home / Self Care | Payer: Self-pay | Source: Home / Self Care | Attending: General Surgery

## 2019-03-22 ENCOUNTER — Ambulatory Visit (HOSPITAL_COMMUNITY)
Admission: RE | Admit: 2019-03-22 | Discharge: 2019-03-22 | Disposition: A | Discharge status: Home / Self Care | Payer: 59 | Attending: General Surgery | Admitting: General Surgery

## 2019-03-22 DIAGNOSIS — I10 Essential (primary) hypertension: Secondary | ICD-10-CM | POA: Insufficient documentation

## 2019-03-22 DIAGNOSIS — Z79899 Other long term (current) drug therapy: Secondary | ICD-10-CM | POA: Insufficient documentation

## 2019-03-22 DIAGNOSIS — K4090 Unilateral inguinal hernia, without obstruction or gangrene, not specified as recurrent: Secondary | ICD-10-CM | POA: Insufficient documentation

## 2019-03-22 HISTORY — PX: INGUINAL HERNIA REPAIR: SHX194

## 2019-03-22 SURGERY — REPAIR, HERNIA, INGUINAL, ADULT
Anesthesia: General | Site: Groin | Laterality: Right

## 2019-03-22 MED ORDER — FENTANYL CITRATE (PF) 100 MCG/2ML IJ SOLN
100.0000 ug | Freq: Once | INTRAMUSCULAR | Status: AC
  Administered 2019-03-22: 09:00:00 100 ug via INTRAVENOUS

## 2019-03-22 MED ORDER — MIDAZOLAM HCL 5 MG/5ML IJ SOLN
INTRAMUSCULAR | Status: DC | PRN
  Administered 2019-03-22: 2 mg via INTRAVENOUS

## 2019-03-22 MED ORDER — GLYCOPYRROLATE 0.2 MG/ML IJ SOLN
INTRAMUSCULAR | Status: DC | PRN
  Administered 2019-03-22: 0.2 mg via INTRAVENOUS

## 2019-03-22 MED ORDER — OXYCODONE HCL 5 MG PO TABS
ORAL_TABLET | ORAL | Status: AC
  Filled 2019-03-22: qty 1

## 2019-03-22 MED ORDER — MIDAZOLAM HCL 2 MG/2ML IJ SOLN
INTRAMUSCULAR | Status: AC
  Filled 2019-03-22: qty 2

## 2019-03-22 MED ORDER — ROCURONIUM BROMIDE 10 MG/ML (PF) SYRINGE
PREFILLED_SYRINGE | INTRAVENOUS | Status: DC | PRN
  Administered 2019-03-22: 50 mg via INTRAVENOUS
  Administered 2019-03-22: 20 mg via INTRAVENOUS

## 2019-03-22 MED ORDER — FENTANYL CITRATE (PF) 250 MCG/5ML IJ SOLN
INTRAMUSCULAR | Status: AC
  Filled 2019-03-22: qty 5

## 2019-03-22 MED ORDER — LACTATED RINGERS IV SOLN
INTRAVENOUS | Status: DC
  Administered 2019-03-22 (×2): via INTRAVENOUS

## 2019-03-22 MED ORDER — FENTANYL CITRATE (PF) 100 MCG/2ML IJ SOLN
INTRAMUSCULAR | Status: AC
  Filled 2019-03-22: qty 2

## 2019-03-22 MED ORDER — SUGAMMADEX SODIUM 200 MG/2ML IV SOLN
INTRAVENOUS | Status: DC | PRN
  Administered 2019-03-22: 400 mg via INTRAVENOUS

## 2019-03-22 MED ORDER — DEXMEDETOMIDINE HCL 200 MCG/2ML IV SOLN
INTRAVENOUS | Status: DC | PRN
  Administered 2019-03-22: 8 ug via INTRAVENOUS

## 2019-03-22 MED ORDER — OXYCODONE HCL 5 MG/5ML PO SOLN: 5.0000 mg | Freq: Once | ORAL | Status: AC | PRN

## 2019-03-22 MED ORDER — CHLORHEXIDINE GLUCONATE CLOTH 2 % EX PADS: 6.0000 | MEDICATED_PAD | Freq: Once | CUTANEOUS | Status: DC

## 2019-03-22 MED ORDER — PROPOFOL 10 MG/ML IV BOLUS
INTRAVENOUS | Status: DC | PRN
  Administered 2019-03-22: 200 mg via INTRAVENOUS

## 2019-03-22 MED ORDER — ACETAMINOPHEN 500 MG PO TABS
1000.0000 mg | ORAL_TABLET | ORAL | Status: AC
  Administered 2019-03-22: 07:00:00 1000 mg via ORAL
  Filled 2019-03-22: qty 2

## 2019-03-22 MED ORDER — LIDOCAINE 2% (20 MG/ML) 5 ML SYRINGE
INTRAMUSCULAR | Status: DC | PRN
  Administered 2019-03-22: 50 mg via INTRAVENOUS

## 2019-03-22 MED ORDER — ONDANSETRON HCL 4 MG/2ML IJ SOLN
INTRAMUSCULAR | Status: DC | PRN
  Administered 2019-03-22: 4 mg via INTRAVENOUS

## 2019-03-22 MED ORDER — CELECOXIB 200 MG PO CAPS
200.0000 mg | ORAL_CAPSULE | ORAL | Status: AC
  Administered 2019-03-22: 07:00:00 200 mg via ORAL
  Filled 2019-03-22: qty 1

## 2019-03-22 MED ORDER — MIDAZOLAM HCL 2 MG/2ML IJ SOLN
INTRAMUSCULAR | Status: AC
  Administered 2019-03-22: 2 mg via INTRAVENOUS
  Filled 2019-03-22: qty 2

## 2019-03-22 MED ORDER — MIDAZOLAM HCL 2 MG/2ML IJ SOLN
2.0000 mg | Freq: Once | INTRAMUSCULAR | Status: AC
  Administered 2019-03-22: 09:00:00 2 mg via INTRAVENOUS

## 2019-03-22 MED ORDER — DEXAMETHASONE SODIUM PHOSPHATE 10 MG/ML IJ SOLN
INTRAMUSCULAR | Status: DC | PRN
  Administered 2019-03-22: 10 mg via INTRAVENOUS

## 2019-03-22 MED ORDER — ONDANSETRON HCL 4 MG/2ML IJ SOLN: 4.0000 mg | Freq: Once | INTRAMUSCULAR | Status: DC | PRN

## 2019-03-22 MED ORDER — FENTANYL CITRATE (PF) 100 MCG/2ML IJ SOLN
INTRAMUSCULAR | Status: AC
  Administered 2019-03-22: 100 ug via INTRAVENOUS
  Filled 2019-03-22: qty 2

## 2019-03-22 MED ORDER — TRAMADOL HCL 50 MG PO TABS: 50.0000 mg | ORAL_TABLET | Freq: Four times a day (QID) | ORAL | 0 refills | Status: AC | PRN

## 2019-03-22 MED ORDER — OXYCODONE HCL 5 MG PO TABS
5.0000 mg | ORAL_TABLET | Freq: Once | ORAL | Status: AC | PRN
  Administered 2019-03-22: 5 mg via ORAL

## 2019-03-22 MED ORDER — BUPIVACAINE HCL (PF) 0.25 % IJ SOLN
INTRAMUSCULAR | Status: AC
  Filled 2019-03-22: qty 30

## 2019-03-22 MED ORDER — FENTANYL CITRATE (PF) 250 MCG/5ML IJ SOLN
INTRAMUSCULAR | Status: DC | PRN
  Administered 2019-03-22: 50 ug via INTRAVENOUS
  Administered 2019-03-22: 100 ug via INTRAVENOUS

## 2019-03-22 MED ORDER — 0.9 % SODIUM CHLORIDE (POUR BTL) OPTIME
TOPICAL | Status: DC | PRN
  Administered 2019-03-22: 1000 mL

## 2019-03-22 MED ORDER — FENTANYL CITRATE (PF) 100 MCG/2ML IJ SOLN
25.0000 ug | INTRAMUSCULAR | Status: DC | PRN
  Administered 2019-03-22: 25 ug via INTRAVENOUS
  Administered 2019-03-22: 50 ug via INTRAVENOUS
  Administered 2019-03-22: 75 ug via INTRAVENOUS

## 2019-03-22 MED ORDER — BUPIVACAINE HCL (PF) 0.25 % IJ SOLN
INTRAMUSCULAR | Status: DC | PRN
  Administered 2019-03-22: 10 mL

## 2019-03-22 MED ORDER — STERILE WATER FOR IRRIGATION IR SOLN
Status: DC | PRN
  Administered 2019-03-22: 1000 mL

## 2019-03-22 SURGICAL SUPPLY — 48 items
ADH SKN CLS APL DERMABOND .7 (GAUZE/BANDAGES/DRESSINGS) ×1
APL PRP STRL LF DISP 70% ISPRP (MISCELLANEOUS) ×1
BLADE CLIPPER SURG (BLADE) ×1 IMPLANT
CANISTER SUCT 3000ML PPV (MISCELLANEOUS) IMPLANT
CHLORAPREP W/TINT 26 (MISCELLANEOUS) ×2 IMPLANT
COVER SURGICAL LIGHT HANDLE (MISCELLANEOUS) ×2 IMPLANT
COVER WAND RF STERILE (DRAPES) ×1 IMPLANT
DERMABOND ADVANCED (GAUZE/BANDAGES/DRESSINGS) ×1
DERMABOND ADVANCED .7 DNX12 (GAUZE/BANDAGES/DRESSINGS) ×1 IMPLANT
DRAIN PENROSE 1/2X12 LTX STRL (WOUND CARE) IMPLANT
DRAPE LAPAROTOMY TRNSV 102X78 (DRAPES) ×2 IMPLANT
ELECT CAUTERY BLADE 6.4 (BLADE) ×1 IMPLANT
ELECT REM PT RETURN 9FT ADLT (ELECTROSURGICAL) ×2
ELECTRODE REM PT RTRN 9FT ADLT (ELECTROSURGICAL) ×1 IMPLANT
GAUZE 4X4 16PLY RFD (DISPOSABLE) ×2 IMPLANT
GLOVE BIO SURGEON STRL SZ7.5 (GLOVE) ×2 IMPLANT
GLOVE BIOGEL PI IND STRL 8 (GLOVE) ×1 IMPLANT
GLOVE BIOGEL PI INDICATOR 8 (GLOVE) ×1
GOWN STRL REUS W/ TWL LRG LVL3 (GOWN DISPOSABLE) ×1 IMPLANT
GOWN STRL REUS W/ TWL XL LVL3 (GOWN DISPOSABLE) ×1 IMPLANT
GOWN STRL REUS W/TWL LRG LVL3 (GOWN DISPOSABLE) ×2
GOWN STRL REUS W/TWL XL LVL3 (GOWN DISPOSABLE) ×2
KIT BASIN OR (CUSTOM PROCEDURE TRAY) ×2 IMPLANT
KIT TURNOVER KIT B (KITS) ×2 IMPLANT
MESH PARIETEX PROGRIP RIGHT (Mesh General) ×1 IMPLANT
NDL HYPO 25GX1X1/2 BEV (NEEDLE) ×1 IMPLANT
NEEDLE HYPO 25GX1X1/2 BEV (NEEDLE) ×4 IMPLANT
NS IRRIG 1000ML POUR BTL (IV SOLUTION) ×2 IMPLANT
PACK GENERAL/GYN (CUSTOM PROCEDURE TRAY) ×2 IMPLANT
PAD ARMBOARD 7.5X6 YLW CONV (MISCELLANEOUS) ×4 IMPLANT
PENCIL SMOKE EVACUATOR (MISCELLANEOUS) ×2 IMPLANT
SPECIMEN JAR SMALL (MISCELLANEOUS) ×2 IMPLANT
SPONGE INTESTINAL PEANUT (DISPOSABLE) IMPLANT
SUT MNCRL AB 4-0 PS2 18 (SUTURE) ×2 IMPLANT
SUT PDS AB 0 CT 36 (SUTURE) IMPLANT
SUT PROLENE 2 0 SH DA (SUTURE) ×2 IMPLANT
SUT VIC AB 0 CT2 27 (SUTURE) IMPLANT
SUT VIC AB 2-0 SH 27 (SUTURE) ×2
SUT VIC AB 2-0 SH 27X BRD (SUTURE) ×1 IMPLANT
SUT VIC AB 3-0 SH 27 (SUTURE) ×2
SUT VIC AB 3-0 SH 27XBRD (SUTURE) ×1 IMPLANT
SUT VICRYL AB 2 0 TIES (SUTURE) ×2 IMPLANT
SYR CONTROL 10ML LL (SYRINGE) ×2 IMPLANT
SYRINGE TOOMEY DISP (SYRINGE) ×2 IMPLANT
TOWEL GREEN STERILE (TOWEL DISPOSABLE) ×1 IMPLANT
TOWEL GREEN STERILE FF (TOWEL DISPOSABLE) ×2 IMPLANT
TRAY FOL W/BAG SLVR 16FR STRL (SET/KITS/TRAYS/PACK) ×1 IMPLANT
TRAY FOLEY W/BAG SLVR 16FR LF (SET/KITS/TRAYS/PACK) ×2

## 2019-03-22 NOTE — H&P (Signed)
History of Present Illness  The patient is a 43 year old male who presents with an inguinal hernia. Referred by: Dr. Georgina Snell Chief Complaint: Right inguinal hernia  Patient is a 43 year old male, who comes in secondary to a right inguinal hernia seen on CT scan. Patient has a history of hypertension. Patient just recently started treatment on this this week. Patient states he was having left back and left inguinal pain. He states that his given a muscle relaxer to see if this helps however this continues to bother him. Patient was sent for CT scan to further evaluate the pain. At this time patient was found have a large right inguinal scrotal hernia. This was unknown to the patient. Patient has had continuing left lower quadrant abdominal pain and left back pain. Patient works at [employer], he does have an active lifestyle.  I did review CT scan personally. CT scan did reveal a right large inguinal hernia with sliding component The bladder.    Past Surgical History Appendectomy   Diagnostic Studies History  Colonoscopy  never  Allergies  No Known Drug Allergies  [01/31/2019]: Allergies Reconciled   Medication History  Lisinopril (10MG  Tablet, Oral) Active. Medications Reconciled  Social History  Alcohol use  Occasional alcohol use. Caffeine use  Coffee. No drug use  Tobacco use  Never smoker.  Family History  Diabetes Mellitus  Father. Rectal Cancer  Mother.  Other Problems Back Pain  High blood pressure     Review of Systems  General Not Present- Appetite Loss, Chills, Fatigue, Fever, Night Sweats, Weight Gain and Weight Loss. Skin Not Present- Change in Wart/Mole, Dryness, Hives, Jaundice, New Lesions, Non-Healing Wounds, Rash and Ulcer. HEENT Not Present- Earache, Hearing Loss, Hoarseness, Nose Bleed, Oral Ulcers, Ringing in the Ears, Seasonal Allergies, Sinus Pain, Sore Throat, Visual Disturbances, Wears glasses/contact  lenses and Yellow Eyes. Respiratory Not Present- Bloody sputum, Chronic Cough, Difficulty Breathing, Snoring and Wheezing. Breast Not Present- Breast Mass, Breast Pain, Nipple Discharge and Skin Changes. Cardiovascular Not Present- Chest Pain, Difficulty Breathing Lying Down, Leg Cramps, Palpitations, Rapid Heart Rate, Shortness of Breath and Swelling of Extremities. Gastrointestinal Present- Abdominal Pain. Not Present- Bloating, Bloody Stool, Change in Bowel Habits, Chronic diarrhea, Constipation, Difficulty Swallowing, Excessive gas, Gets full quickly at meals, Hemorrhoids, Indigestion, Nausea, Rectal Pain and Vomiting. Male Genitourinary Not Present- Blood in Urine, Change in Urinary Stream, Frequency, Impotence, Nocturia, Painful Urination, Urgency and Urine Leakage. Musculoskeletal Present- Back Pain and Muscle Pain. Not Present- Joint Pain, Joint Stiffness, Muscle Weakness and Swelling of Extremities. Neurological Not Present- Decreased Memory, Fainting, Headaches, Numbness, Seizures, Tingling, Tremor, Trouble walking and Weakness. Psychiatric Not Present- Anxiety, Bipolar, Change in Sleep Pattern, Depression, Fearful and Frequent crying. Endocrine Not Present- Cold Intolerance, Excessive Hunger, Hair Changes, Heat Intolerance, Hot flashes and New Diabetes. Hematology Not Present- Blood Thinners, Easy Bruising, Excessive bleeding, Gland problems, HIV and Persistent Infections. All other systems negative  BP (!) 151/72   Pulse 60   Temp 98.3 F (36.8 C)   Resp 18   Ht 5\' 8"  (1.727 m)   Wt (!) 139.3 kg   SpO2 98%   BMI 46.70 kg/m      Physical Exam  The physical exam findings are as follows: Note: Constitutional: No acute distress, conversant, appears stated age  Eyes: Anicteric sclerae, moist conjunctiva, no lid lag  Neck: No thyromegaly, trachea midline, no cervical lymphadenopathy  Lungs: Clear to auscultation biilaterally, normal respiratory  effot  Cardiovascular: regular rate & rhythm,  no murmurs, no peripheal edema, pedal pulses 2+  GI: Soft, no masses or hepatosplenomegaly, non-tender to palpation  MSK: Normal gait, no clubbing cyanosis, edema  Skin: No rashes, palpation reveals normal skin turgor  Psychiatric: Appropriate judgment and insight, oriented to person, place, and time  Abdomen Inspection Hernias - Inguinal hernia - Right - Reducible - Right (Large inguinal scrotal) .    Assessment & Plan  RIGHT INGUINAL HERNIA (K40.90) Impression: 43 year old male with a history of right inguinal hernia, history of hypertension Patient follow-up with his PCP at the end of October for blood pressure check. 1. The patient will like to proceed to the operating room for open right inguinal hernia repair with mesh.  2. I discussed with the patient the signs and symptoms of incarceration and strangulation and the need to proceed to the ER should they occur.  3. I discussed with the patient the risks and benefits of the procedure to include but not limited to: Infection, bleeding, damage to surrounding structures, possible need for further surgery, possible nerve pain, and possible recurrence. The patient was understanding and wishes to proceed.

## 2019-03-22 NOTE — Transfer of Care (Signed)
Immediate Anesthesia Transfer of Care Note  Patient: Wellsite geologist  Procedure(s) Performed: OPEN RIGHT INGUINAL HERNIA REPAIR WITH MESH (Right Groin)  Patient Location: PACU  Anesthesia Type:GA combined with regional for post-op pain  Level of Consciousness: awake, alert  and oriented  Airway & Oxygen Therapy: Patient Spontanous Breathing and Patient connected to nasal cannula oxygen  Post-op Assessment: Report given to RN, Post -op Vital signs reviewed and stable and Patient moving all extremities X 4  Post vital signs: Reviewed and stable  Last Vitals:  Vitals Value Taken Time  BP    Temp    Pulse 63 03/22/19 1022  Resp 47 03/22/19 1020  SpO2 100 % 03/22/19 1022  Vitals shown include unvalidated device data.  Last Pain:  Vitals:   03/22/19 0721  PainSc: 0-No pain         Complications: No apparent anesthesia complications

## 2019-03-22 NOTE — Progress Notes (Signed)
@  1420 wated 39mcg Fentanyl in trash / witness Leisure centre manager

## 2019-03-22 NOTE — Anesthesia Procedure Notes (Signed)
Procedure Name: Intubation Date/Time: 03/22/2019 9:11 AM Performed by: Mariea Clonts, CRNA Pre-anesthesia Checklist: Patient identified, Emergency Drugs available, Suction available and Patient being monitored Patient Re-evaluated:Patient Re-evaluated prior to induction Oxygen Delivery Method: Circle System Utilized Preoxygenation: Pre-oxygenation with 100% oxygen Induction Type: IV induction and Cricoid Pressure applied Ventilation: Mask ventilation without difficulty Laryngoscope Size: Mac and 4 Grade View: Grade II Tube type: Oral Number of attempts: 1 Airway Equipment and Method: Stylet and Oral airway Placement Confirmation: ETT inserted through vocal cords under direct vision,  positive ETCO2 and breath sounds checked- equal and bilateral Tube secured with: Tape Dental Injury: Teeth and Oropharynx as per pre-operative assessment

## 2019-03-22 NOTE — Discharge Instructions (Signed)
[facility] _______[facility] °INGUINAL HERNIA REPAIR: POST OP INSTRUCTIONS ° °Always review your discharge instruction sheet given to you by the facility where your surgery was performed. °IF YOU HAVE DISABILITY OR FAMILY LEAVE FORMS, YOU MUST BRING THEM TO THE OFFICE FOR PROCESSING.   °DO NOT GIVE THEM TO YOUR DOCTOR. ° °1. A  prescription for pain medication may be given to you upon discharge.  Take your pain medication as prescribed, if needed.  If narcotic pain medicine is not needed, then you may take acetaminophen (Tylenol) or ibuprofen (Advil) as needed. °2. Take your usually prescribed medications unless otherwise directed. °If you need a refill on your pain medication, please contact your pharmacy.  They will contact our office to request authorization. Prescriptions will not be filled after 5 pm or on week-ends. °3. You should follow a light diet the first 24 hours after arrival home, such as soup and crackers, etc.  Be sure to include lots of fluids daily.  Resume your normal diet the day after surgery. °4.Most patients will experience some swelling and bruising around the umbilicus or in the groin and scrotum.  Ice packs and reclining will help.  Swelling and bruising can take several days to resolve.  °6. It is common to experience some constipation if taking pain medication after surgery.  Increasing fluid intake and taking a stool softener (such as Colace) will usually help or prevent this problem from occurring.  A mild laxative (Milk of Magnesia or Miralax) should be taken according to package directions if there are no bowel movements after 48 hours. °7. Unless discharge instructions indicate otherwise, you may remove your bandages 24-48 hours after surgery, and you may shower at that time.  You may have steri-strips (small skin tapes) in place directly over the incision.  These strips should be left on the skin for 7-10 days.  If your surgeon used skin glue on the incision, you may  shower in 24 hours.  The glue will flake off over the next 2-3 weeks.  Any sutures or staples will be removed at the office during your follow-up visit. °8. ACTIVITIES:  You may resume regular (light) daily activities beginning the next day--such as daily self-care, walking, climbing stairs--gradually increasing activities as tolerated.  You may have sexual intercourse when it is comfortable.  Refrain from any heavy lifting or straining until approved by your doctor. ° °a.You may drive when you are no longer taking prescription pain medication, you can comfortably wear a seatbelt, and you can safely maneuver your car and apply brakes. °b.RETURN TO WORK:   °_____________________________________________ ° °9.You should see your doctor in the office for a follow-up appointment approximately 2-3 weeks after your surgery.  Make sure that you call for this appointment within a day or two after you arrive home to insure a convenient appointment time. °10.OTHER INSTRUCTIONS: _________________________ °   _____________________________________ ° °WHEN TO CALL YOUR DOCTOR: °1. Fever over 101.0 °2. Inability to urinate °3. Nausea and/or vomiting °4. Extreme swelling or bruising °5. Continued bleeding from incision. °6. Increased pain, redness, or drainage from the incision ° °The clinic staff is available to answer your questions during regular business hours.  Please don’t hesitate to call and ask to speak to one of the nurses for clinical concerns.  If you have a medical emergency, go to the nearest emergency room or call 911.  A surgeon from [facility] is always on call at the hospital ° ° °[address], [address], [place], [place]  [address] ? ° [address] [address], [place], [place]   [address] °[phone] ? [phone] ? FAX [phone] °Web site: [email or web address] ° °

## 2019-03-22 NOTE — Anesthesia Procedure Notes (Signed)
Anesthesia Regional Block: TAP block   Pre-Anesthetic Checklist: ,, timeout performed, Correct Patient, Correct Site, Correct Laterality, Correct Procedure, Correct Position, site marked, Risks and benefits discussed, pre-op evaluation,  At surgeon's request and post-op pain management  Laterality: Right  Prep: Maximum Sterile Barrier Precautions used, chloraprep       Needles:  Injection technique: Single-shot  Needle Type: Echogenic Stimulator Needle     Needle Length: 9cm  Needle Gauge: 21     Additional Needles:   Narrative:  Start time: 03/22/2019 8:30 AM End time: 03/22/2019 8:40 AM Injection made incrementally with aspirations every 5 mL.  Performed by: Personally  Anesthesiologist: Roberts Gaudy, MD  Additional Notes: 30 cc 0.5% Bupivacaine with 1:200 epi 10 cc 1.3% Exparel injected easily

## 2019-03-22 NOTE — Anesthesia Postprocedure Evaluation (Signed)
Anesthesia Post Note  Patient: Research scientist (medical)) Performed: OPEN RIGHT INGUINAL HERNIA REPAIR WITH MESH (Right Groin)     Patient location during evaluation: PACU Anesthesia Type: General Level of consciousness: awake and alert Pain management: pain level controlled Vital Signs Assessment: post-procedure vital signs reviewed and stable Respiratory status: spontaneous breathing, nonlabored ventilation, respiratory function stable and patient connected to nasal cannula oxygen Cardiovascular status: blood pressure returned to baseline and stable Postop Assessment: no apparent nausea or vomiting Anesthetic complications: no    Last Vitals:  Vitals:   03/22/19 1051 03/22/19 1107  BP: 117/65 118/63  Pulse: (!) 55 (!) 58  Resp: 15 18  Temp:  (!) 36.1 C  SpO2: 95% 97%    Last Pain:  Vitals:   03/22/19 1115  PainSc: 2                  Andrew Tucker

## 2019-03-22 NOTE — Op Note (Signed)
03/22/2019  10:09 AM  PATIENT:  Andrew Tucker  43 y.o. male  PRE-OPERATIVE DIAGNOSIS:  RIGHT INGUINAL HERNIA  POST-OPERATIVE DIAGNOSIS:  RIGHT Direct INGUINAL HERNIA, sliding with bladder  PROCEDURE:  Procedure(s): OPEN RIGHT INGUINAL HERNIA REPAIR WITH MESH (Right)  SURGEON:  Surgeon(s) and Role:    Ralene Ok, MD - Primary  ANESTHESIA:   local and general  EBL:  5 mL   BLOOD ADMINISTERED:none  DRAINS: none   LOCAL MEDICATIONS USED:  BUPIVICAINE   SPECIMEN:  No Specimen  DISPOSITION OF SPECIMEN:  N/A  COUNTS:  YES  TOURNIQUET:  * No tourniquets in log *  DICTATION: .Dragon Dictation Details of the procedure: The patient was taken back to the operating room. The patient was placed in supine position with bilateral SCDs in place. The patient was prepped and draped in the usual sterile fashion.  After appropriate anitbiotics were confirmed, a time-out was confirmed and all facts were verified.   A 5 cm incision was made just 1 cm superior to the inguinal ligament. Bovie cautery was used to maintain hemostasis dissection is carried down to the external oblique.  A standard incision was made laterally, and the external oblique was bluntly dissected away from the surrounding tissue with Metzenbaum scissors. The external oblique was elevated in the spermatic cord was bluntly dissected away from the surrounding tissue.  The ilioinguinal nerve was identified and ligated with an 2-0 dyed vicryl.   The spermatic cord and the hernia were then bluntly dissected away from the pubic tubercle and a Penrose was placed around the hernia sac in the spermatic cord. The vas deferens was identified and protected at all portions of the case. Dissection of the cremasterics took place with Bovie cautery.  The cord was very fatty. The hernia could be seen.  It appeared to be a sliding inguinal hernia with bladder.  There was no hernia sac and I felt this to be all preperitoneal.  This was  reinserted into the abdominal cavity easily.  The hernia appeared to be in the direct area.  At this time a Right-sided Progrip mesh was then anchored to the pubic tubercle with a 2-0 Prolene.  It was anchored to the shelving edge of the external oblique x 1 and the conjoint tendon cephalad x 1.  The wrap around of the mesh was sutured to the conjoint tendon as well.  The new internal ring did not strangulate the spermatic cord.   The tail was then tucked under the external oblique. At this time the area was irrigated out with sterile saline.    The external oblique was reapproximated using a 2-0 Vicryl in a running fashion. Scarpa's fascia was then reapproximated using a 3-0 Vicryl running fashion. The skin was then reapproximated with 4 Monocryl in a subcuticular fashion. The skin was then dressed with Dermabond.  The patient was taken to the recovery room in stable condition.   PLAN OF CARE: Discharge to home after PACU  PATIENT DISPOSITION:  PACU - hemodynamically stable.   Delay start of Pharmacological VTE agent (>24hrs) due to surgical blood loss or risk of bleeding: not applicable

## 2019-03-22 NOTE — Anesthesia Preprocedure Evaluation (Signed)
Anesthesia Evaluation  Patient identified by MRN, date of birth, ID band Patient awake    Reviewed: Allergy & Precautions, NPO status , Patient's Chart, lab work & pertinent test results  Airway Mallampati: II  TM Distance: >3 FB Neck ROM: Full    Dental  (+) Teeth Intact, Dental Advisory Given   Pulmonary    breath sounds clear to auscultation       Cardiovascular hypertension,  Rhythm:Regular Rate:Normal     Neuro/Psych    GI/Hepatic   Endo/Other    Renal/GU      Musculoskeletal   Abdominal (+) + obese,   Peds  Hematology   Anesthesia Other Findings   Reproductive/Obstetrics                             Anesthesia Physical Anesthesia Plan  ASA: II  Anesthesia Plan: General   Post-op Pain Management:  Regional for Post-op pain   Induction: Intravenous  PONV Risk Score and Plan: Ondansetron  Airway Management Planned: Oral ETT  Additional Equipment:   Intra-op Plan:   Post-operative Plan: Extubation in OR  Informed Consent: I have reviewed the patients History and Physical, chart, labs and discussed the procedure including the risks, benefits and alternatives for the proposed anesthesia with the patient or authorized representative who has indicated his/her understanding and acceptance.     Dental advisory given  Plan Discussed with: Anesthesiologist and CRNA  Anesthesia Plan Comments:         Anesthesia Quick Evaluation

## 2019-03-25 ENCOUNTER — Encounter (HOSPITAL_COMMUNITY): Payer: Self-pay | Admitting: General Surgery

## 2019-06-21 ENCOUNTER — Ambulatory Visit: Payer: 59 | Attending: Internal Medicine

## 2019-06-21 DIAGNOSIS — Z23 Encounter for immunization: Secondary | ICD-10-CM

## 2019-06-21 NOTE — Progress Notes (Signed)
   Covid-19 Vaccination Clinic  Name:  Andrew Tucker    MRN: [id number] DOB: 06-20-1975  06/21/2019  Andrew Tucker was observed post Covid-19 immunization for 15 minutes without incidence. He was provided with Vaccine Information Sheet and instruction to access the V-Safe system.   Andrew Tucker was instructed to call 911 with any severe reactions post vaccine: Marland Kitchen Difficulty breathing  . Swelling of your face and throat  . A fast heartbeat  . A bad rash all over your body  . Dizziness and weakness    Immunizations Administered    Name Date Dose VIS Date Route   Pfizer COVID-19 Vaccine 06/21/2019  5:10 PM 0.3 mL 04/12/2019 Intramuscular   Manufacturer: [employer], [employer]   Lot: [id number]   NDC: [address]-9

## 2019-07-16 ENCOUNTER — Ambulatory Visit: Payer: 59 | Attending: Internal Medicine

## 2019-07-16 DIAGNOSIS — Z23 Encounter for immunization: Secondary | ICD-10-CM

## 2019-07-16 NOTE — Progress Notes (Signed)
   Covid-19 Vaccination Clinic  Name:  Andrew Tucker    MRN: [id number] DOB: [birth year]  07/16/2019  [name] was observed post Covid-19 immunization for 15 minutes without incident. He was provided with Vaccine Information Sheet and instruction to access the V-Safe system.   [name] was instructed to call 911 with any severe reactions post vaccine: Marland Kitchen Difficulty breathing  . Swelling of face and throat  . A fast heartbeat  . A bad rash all over body  . Dizziness and weakness   Immunizations Administered    Name Date Dose VIS Date Route   Pfizer COVID-19 Vaccine 07/16/2019  1:37 PM 0.3 mL 04/12/2019 Intramuscular   Manufacturer: [employer], [employer]   Lot: [id number]   NDC: [address]-3

## 2020-03-09 ENCOUNTER — Other Ambulatory Visit: Payer: Self-pay | Admitting: Family Medicine

## 2020-03-09 NOTE — Telephone Encounter (Signed)
You haven't seen this pt in over year.  Please advise regarding primary care refill.

## 2021-01-28 ENCOUNTER — Other Ambulatory Visit: Payer: Self-pay

## 2021-01-28 ENCOUNTER — Encounter: Payer: Self-pay | Admitting: *Deleted

## 2021-01-28 ENCOUNTER — Emergency Department
Admission: EM | Admit: 2021-01-28 | Discharge: 2021-01-28 | Disposition: A | Discharge status: Home / Self Care | Payer: 59 | Source: Home / Self Care

## 2021-01-28 DIAGNOSIS — J309 Allergic rhinitis, unspecified: Secondary | ICD-10-CM | POA: Diagnosis not present

## 2021-01-28 DIAGNOSIS — J0100 Acute maxillary sinusitis, unspecified: Secondary | ICD-10-CM

## 2021-01-28 MED ORDER — FEXOFENADINE HCL 180 MG PO TABS: 180.0000 mg | ORAL_TABLET | Freq: Every day | ORAL | 0 refills | Status: AC

## 2021-01-28 MED ORDER — CEFDINIR 300 MG PO CAPS: 300.0000 mg | ORAL_CAPSULE | Freq: Two times a day (BID) | ORAL | 0 refills | Status: AC

## 2021-01-28 NOTE — ED Provider Notes (Signed)
Andrew Tucker [facility]ARE    CSN: [id number] Arrival date & time: 01/28/21  1312      History   Chief Complaint Chief Complaint  Patient presents with  . Nasal Congestion  . Cough    HPI Andrew Tucker is a 45 y.o. male.   HPI Pleasant 45 year old male presents with cough and congestion for 2 weeks.  Patient reports wheezing at night and taking OTC Mucinex.  Past Medical History:  Diagnosis Date  . Hypertension 01/29/2019  . Morbid obesity (HCC) 01/14/2019  . Non-recurrent unilateral inguinal hernia without obstruction or gangrene 01/21/2019    Patient Active Problem List   Diagnosis Date Noted  . Hypertension 01/29/2019  . Non-recurrent unilateral inguinal hernia without obstruction or gangrene 01/21/2019  . Morbid obesity (HCC) 01/14/2019  . Bilateral hearing loss 09/14/2016  . 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 APPENDECTOMY    . HAND SURGERY    . INGUINAL HERNIA REPAIR Right 03/22/2019   Procedure: OPEN RIGHT INGUINAL HERNIA REPAIR WITH MESH;  Surgeon: Axel Filler, MD;  Location: [facility] OR;  Service: General;  Laterality: Right;       Home Medications    Prior to Admission medications   Medication Sig Start Date End Date Taking? Authorizing Provider  cefdinir (OMNICEF) 300 MG capsule Take 1 capsule (300 mg total) by mouth 2 (two) times daily for 7 days. 01/28/21 02/04/21 Yes Trevor Iha, FNP  fexofenadine [facility] ALLERGY) 180 MG tablet Take 1 tablet (180 mg total) by mouth daily for 15 days. 01/28/21 02/12/21 Yes Trevor Iha, FNP    Family History Family History  Problem Relation Age of Onset  . Cancer Mother     Social History Social History   Tobacco Use  . Smoking status: Never  . Smokeless tobacco: Former    Types: Chew    Quit date: 1998  Advertising account planner  . Vaping Use: Never used  Substance Use Topics  . Alcohol use: Yes    Comment: occas.  . Drug use: No     Allergies    Other   Review of Systems Review of Systems  HENT:  Positive for congestion and postnasal drip.   Respiratory:  Positive for cough.   All other systems reviewed and are negative.   Physical Exam Triage Vital Signs ED Triage Vitals  Enc Vitals Group     BP 01/28/21 1354 138/88     Pulse Rate 01/28/21 1354 (!) 56     Resp 01/28/21 1354 18     Temp 01/28/21 1354 98.2 F (36.8 C)     Temp Source 01/28/21 1354 Oral     SpO2 01/28/21 1354 97 %     Weight --      Height --      Head Circumference --      Peak Flow --      Pain Score 01/28/21 1356 0     Pain Loc --      Pain Edu? --      Excl. in GC? --    No data found.  Updated Vital Signs BP 138/88 (BP Location: Left Arm)   Pulse (!) 56   Temp 98.2 F (36.8 C) (Oral)   Resp 18   SpO2 97%      Physical Exam Vitals and nursing note reviewed.  Constitutional:      Appearance: Normal appearance. He is obese. He is not ill-appearing.  HENT:  Head: Normocephalic and atraumatic.     Right Ear: Tympanic membrane and external ear normal.     Left Ear: Tympanic membrane and external ear normal.     Ears:     Comments: Moderate eustachian tube dysfunction noted bilaterally    Nose: Nose normal.     Mouth/Throat:     Mouth: Mucous membranes are moist.     Pharynx: Oropharynx is clear.     Comments: Moderate amount of clear drainage of posterior oropharynx noted Eyes:     Extraocular Movements: Extraocular movements intact.     Conjunctiva/sclera: Conjunctivae normal.     Pupils: Pupils are equal, round, and reactive to light.  Cardiovascular:     Rate and Rhythm: Normal rate and regular rhythm.     Pulses: Normal pulses.     Heart sounds: Normal heart sounds.  Pulmonary:     Effort: Pulmonary effort is normal.     Breath sounds: Normal breath sounds. No wheezing, rhonchi or rales.  Musculoskeletal:        General: Normal range of motion.     Cervical back: Normal range of motion and neck supple. No  tenderness.  Lymphadenopathy:     Cervical: No cervical adenopathy.  Skin:    General: Skin is warm and dry.  Neurological:     General: No focal deficit present.     Mental Status: He is alert and oriented to person, place, and time. Mental status is at baseline.     UC Treatments / Results  Labs (all labs ordered are listed, but only abnormal results are displayed) Labs Reviewed - No data to display  EKG   Radiology No results found.  Procedures Procedures (including critical care time)  Medications Ordered in UC Medications - No data to display  Initial Impression / Assessment and Plan / UC Course  I have reviewed the triage vital signs and the nursing notes.  Pertinent labs & imaging results that were available during my care of the patient were reviewed by me and considered in my medical decision making (see chart for details).     MDM: 1.  Subacute maxillary sinusitis-Rx'd cefdinir; 2.  Allergic rhinitis, unspecified seasonality, unspecified trigger-Rx'd Allegra. Advised patient to take medication as directed with food to completion.  Advised patient to take Allegra daily for the next 7 days with first dose of antibiotic current postnasal drainage/drip, then as needed.  Encouraged patient to increase daily water intake while taking these medications.  Patient discharged home, hemodynamically stable. Final Clinical Impressions(s) / UC Diagnoses   Final diagnoses:  Subacute maxillary sinusitis  Allergic rhinitis, unspecified seasonality, unspecified trigger     Discharge Instructions      Advised patient to take medication as directed with food to completion.  Advised patient to take Allegra daily for the next 7 days with first dose of antibiotic current postnasal drainage/drip, then as needed.  Encouraged patient to increase daily water intake while taking these medications.     ED Prescriptions     Medication Sig Dispense Auth. Provider   cefdinir  (OMNICEF) 300 MG capsule Take 1 capsule (300 mg total) by mouth 2 (two) times daily for 7 days. 14 capsule Trevor Iha, FNP   fexofenadine [facility] ALLERGY) 180 MG tablet Take 1 tablet (180 mg total) by mouth daily for 15 days. 15 tablet Trevor Iha, FNP      PDMP not reviewed this encounter.   Trevor Iha, FNP 01/28/21 1457

## 2021-01-28 NOTE — Discharge Instructions (Addendum)
Advised patient to take medication as directed with food to completion.  Advised patient to take Allegra daily for the next 7 days with first dose of antibiotic current postnasal drainage/drip, then as needed.  Encouraged patient to increase daily water intake while taking these medications.

## 2021-01-28 NOTE — ED Triage Notes (Signed)
Patient c/o 2 weeks of cough and congestion. Afebrile. Wheezing at night. Taking mucinex.

## 2021-05-11 IMAGING — CT CT ABD-PELV W/ CM
2 of 5 series · 15 of 46 positions shown, 17 images · IV contrast (APPLIED)
Comparison: No priors.

CLINICAL DATA: 43-year-old male with history of acute generalized
abdominal pain. Left-sided back pain.

EXAM:
CT ABDOMEN AND PELVIS WITH CONTRAST
TECHNIQUE: Multidetector CT imaging of the abdomen and pelvis was performed
using the standard protocol following bolus administration of
intravenous contrast.
CONTRAST:  100mL OMNIPAQUE IOHEXOL 300 MG/ML  SOLN

[Series 2: axial st · axial · 0.98mm/px · z∈[-586,-40]mm · 12 of 123 slices shown, 14 images]
[im 7/123  soft-tissue]
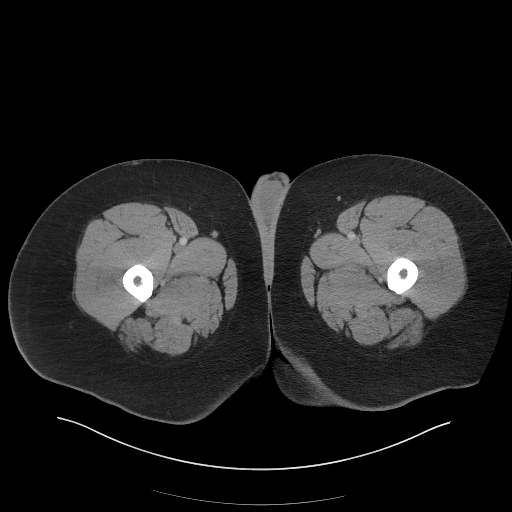
[im 7/123  bone]
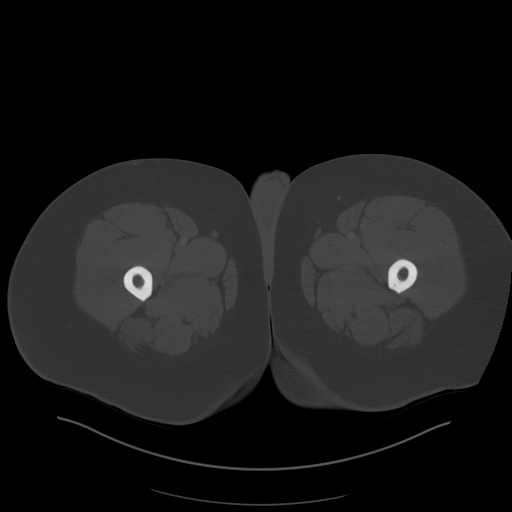
[im 20/123  soft-tissue]
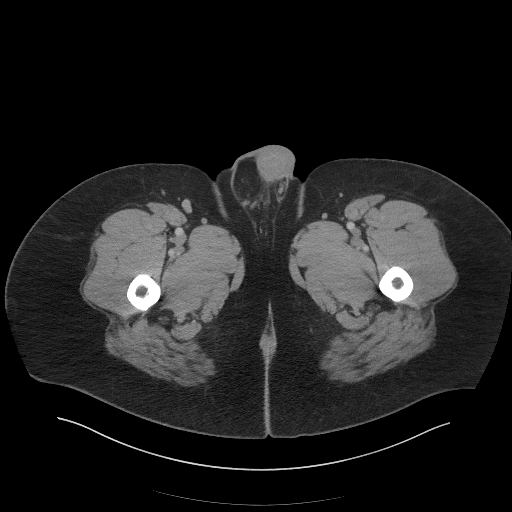
[im 26/123  soft-tissue]
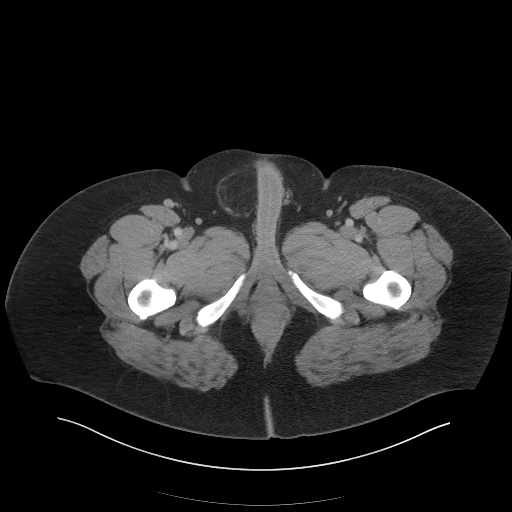
[im 39/123  soft-tissue]
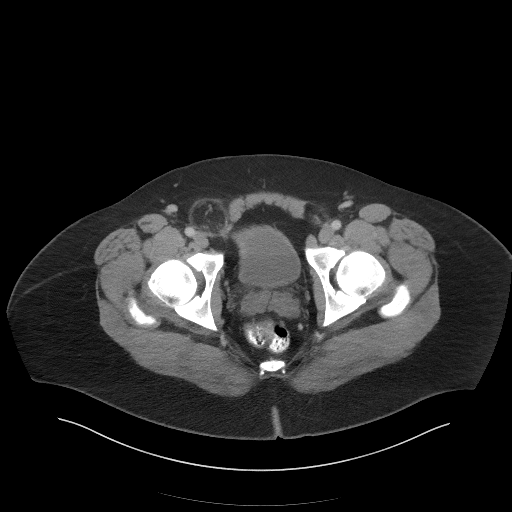
[im 45/123  soft-tissue]
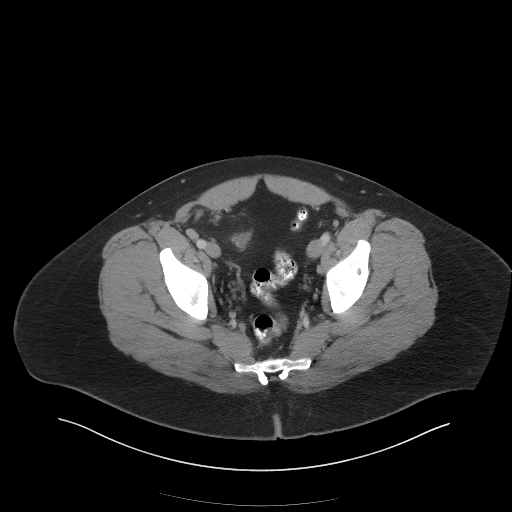
[im 58/123  soft-tissue]
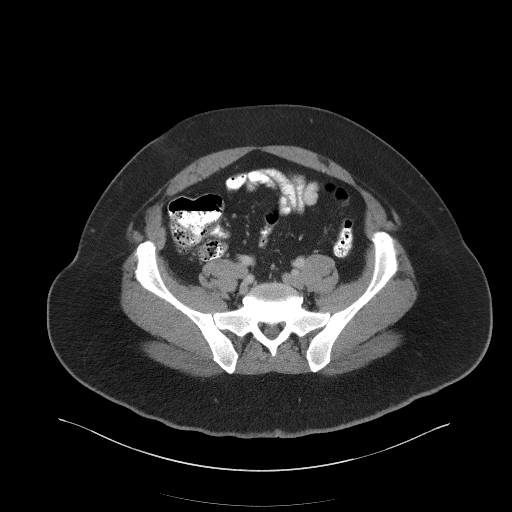
[im 65/123  soft-tissue]
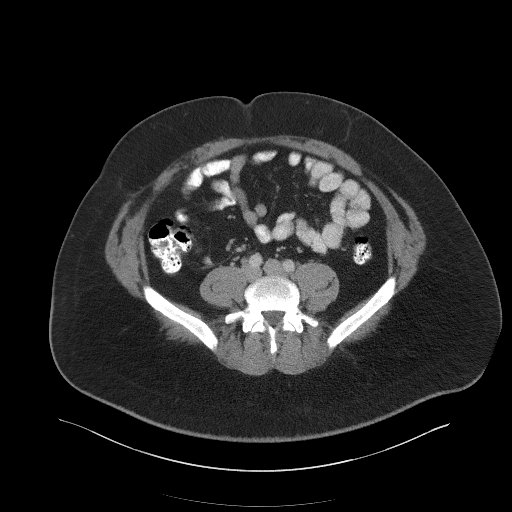
[im 78/123  soft-tissue]
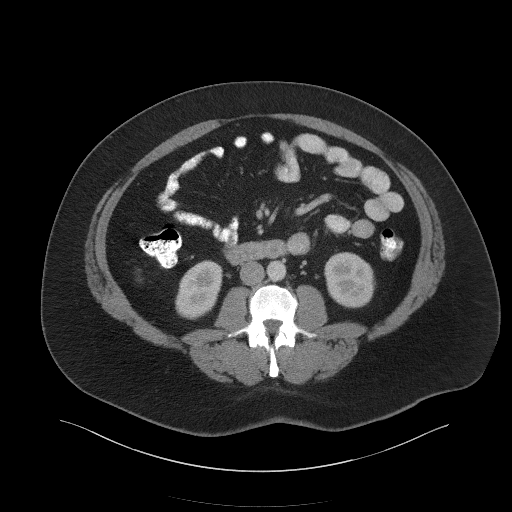
[im 84/123  soft-tissue]
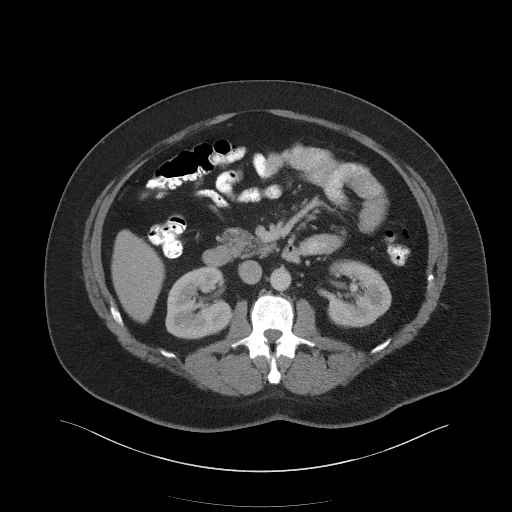
[im 84/123  bone]
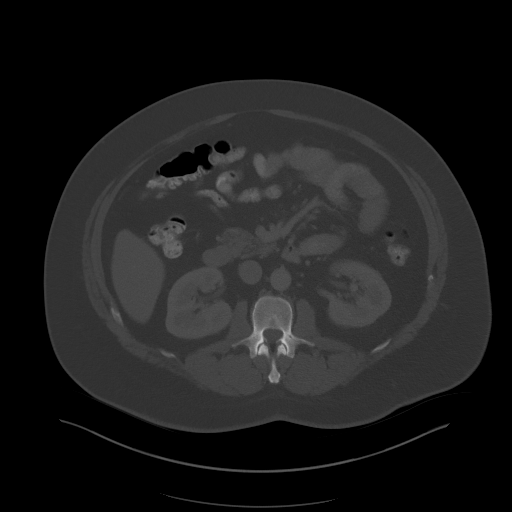
[im 97/123  soft-tissue]
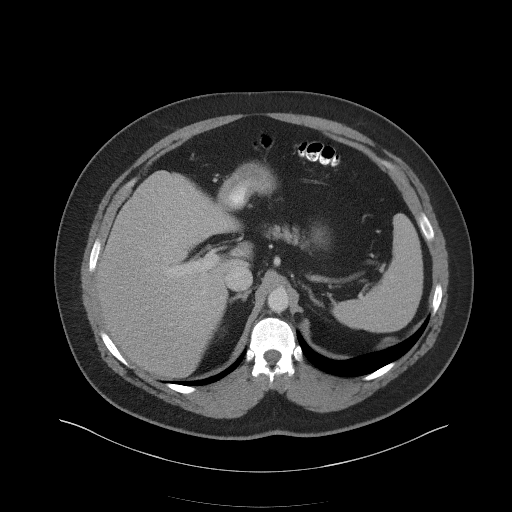
[im 103/123  soft-tissue]
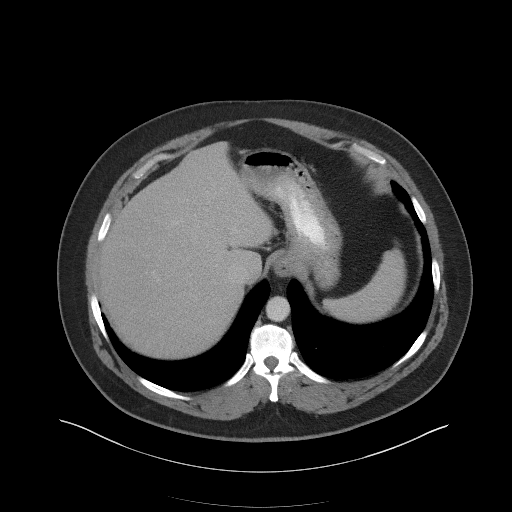
[im 116/123  soft-tissue]
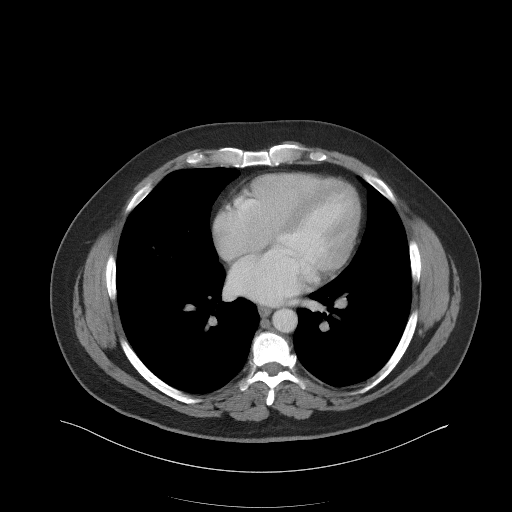

[Series 5: coronal st · coronal · 0.99mm/px · 3 of 119 slices shown]
[im 40/119  soft-tissue]
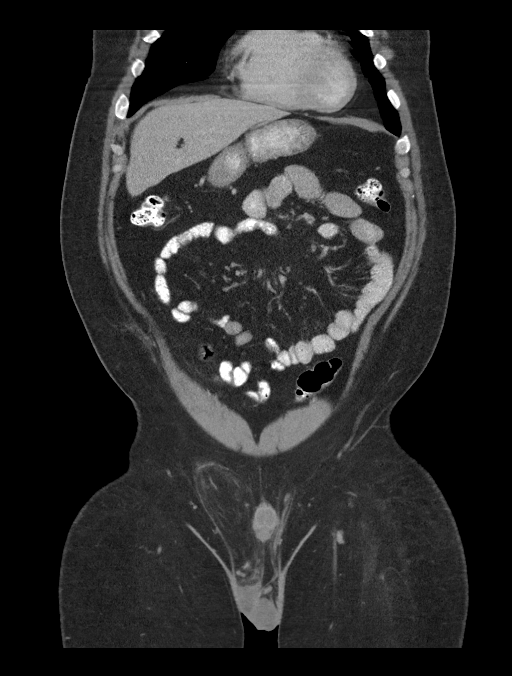
[im 53/119  soft-tissue]
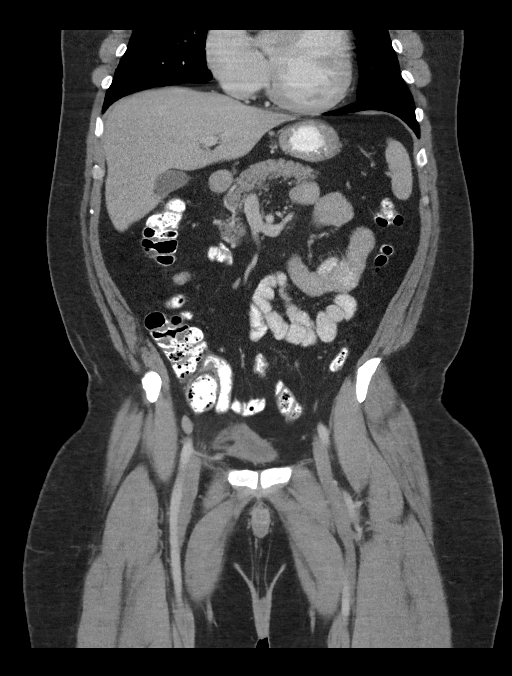
[im 66/119  soft-tissue]
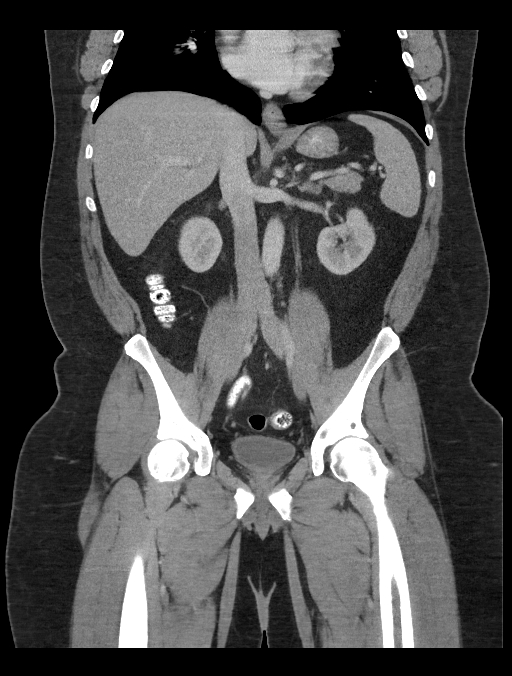

[15 of 46 positions shown; findings below may reference images not displayed]

FINDINGS: Lower chest: Unremarkable.

Hepatobiliary: No suspicious cystic or solid hepatic lesions. No
intra or extrahepatic biliary ductal dilatation. Gallbladder is
normal in appearance.

Pancreas: No pancreatic mass. No pancreatic ductal dilatation. No
pancreatic or peripancreatic fluid collections or inflammatory
changes.

Spleen: Unremarkable.

Adrenals/Urinary Tract: Bilateral kidneys and adrenal glands are
normal in appearance. No hydroureteronephrosis. The anterolateral
aspect of the urinary bladder on the right partially extends into a
right inguinoscrotal hernia.

Stomach/Bowel: Normal appearance of the stomach. No pathologic
dilatation of small bowel or colon. The appendix is not confidently
identified and may be surgically absent. Regardless, there are no
inflammatory changes noted adjacent to the cecum to suggest the
presence of an acute appendicitis at this time.

Vascular/Lymphatic: No significant atherosclerotic disease, aneurysm
or dissection noted in the abdominal or pelvic vasculature. No
lymphadenopathy noted in the abdomen or pelvis.

Reproductive: Prostate gland and seminal vesicles are unremarkable
in appearance.

Other: Large right inguinoscrotal hernia containing predominantly
fat, as well as a small portion of the anterolateral aspect of the
urinary bladder on the right side. No significant volume of ascites.
No pneumoperitoneum.

Musculoskeletal: There are no aggressive appearing lytic or blastic
lesions noted in the visualized portions of the skeleton.
IMPRESSION: 1. No acute findings are noted in the abdomen or pelvis.
2. There is a large right inguinoscrotal hernia which contains
predominantly fat, as well as a small portion of the anterolateral
aspect of the urinary bladder on the right side.
